# Patient Record
Sex: Female | Born: 1975 | Race: White | Hispanic: No | Smoking: Current every day smoker
Health system: Southern US, Community
[De-identification: ages and names within clinical notes are randomized; demographics above are authoritative.]

## PROBLEM LIST (undated history)

## (undated) DIAGNOSIS — R000 Tachycardia, unspecified: Secondary | ICD-10-CM

## (undated) DIAGNOSIS — F1010 Alcohol abuse, uncomplicated: Secondary | ICD-10-CM

## (undated) DIAGNOSIS — E7800 Pure hypercholesterolemia, unspecified: Secondary | ICD-10-CM

## (undated) HISTORY — PX: BREAST ENHANCEMENT SURGERY: SHX7

---

## 2014-04-18 ENCOUNTER — Encounter (HOSPITAL_COMMUNITY): Payer: Self-pay | Admitting: Emergency Medicine

## 2014-04-18 ENCOUNTER — Emergency Department (HOSPITAL_COMMUNITY)
Admission: EM | Admit: 2014-04-18 | Discharge: 2014-04-18 | Disposition: A | Discharge status: Home / Self Care | Payer: Medicaid Other | Attending: Emergency Medicine | Admitting: Emergency Medicine

## 2014-04-18 DIAGNOSIS — Y929 Unspecified place or not applicable: Secondary | ICD-10-CM | POA: Insufficient documentation

## 2014-04-18 DIAGNOSIS — W269XXA Contact with unspecified sharp object(s), initial encounter: Secondary | ICD-10-CM | POA: Insufficient documentation

## 2014-04-18 DIAGNOSIS — S61219A Laceration without foreign body of unspecified finger without damage to nail, initial encounter: Secondary | ICD-10-CM

## 2014-04-18 DIAGNOSIS — Y9389 Activity, other specified: Secondary | ICD-10-CM | POA: Insufficient documentation

## 2014-04-18 DIAGNOSIS — S61209A Unspecified open wound of unspecified finger without damage to nail, initial encounter: Secondary | ICD-10-CM | POA: Insufficient documentation

## 2014-04-18 DIAGNOSIS — F17200 Nicotine dependence, unspecified, uncomplicated: Secondary | ICD-10-CM | POA: Insufficient documentation

## 2014-04-18 MED ORDER — IBUPROFEN 800 MG PO TABS
ORAL_TABLET | ORAL | Status: AC
  Filled 2014-04-18: qty 1

## 2014-04-18 MED ORDER — IBUPROFEN 800 MG PO TABS
800.0000 mg | ORAL_TABLET | Freq: Once | ORAL | Status: AC
  Administered 2014-04-18: 800 mg via ORAL

## 2014-04-18 MED ORDER — LIDOCAINE HCL (PF) 1 % IJ SOLN
INTRAMUSCULAR | Status: DC
Start: 2014-04-18 — End: 2014-04-18
  Filled 2014-04-18: qty 5

## 2014-04-18 MED ORDER — LIDOCAINE HCL (PF) 1 % IJ SOLN: 5.0000 mL | Freq: Once | INTRAMUSCULAR | Status: DC

## 2014-04-18 NOTE — Discharge Instructions (Signed)
Laceration Care, Adult °A laceration is a cut that goes through all layers of the skin. The cut goes into the tissue beneath the skin. °HOME CARE °For stitches (sutures) or staples: °· Keep the cut clean and dry. °· If you have a bandage (dressing), change it at least once a day. Change the bandage if it gets wet or dirty, or as told by your doctor. °· Wash the cut with soap and water 2 times a day. Rinse the cut with water. Pat it dry with a clean towel. °· Put a thin layer of medicated cream on the cut as told by your doctor. °· You may shower after the first 24 hours. Do not soak the cut in water until the stitches are removed. °· Only take medicines as told by your doctor. °· Have your stitches or staples removed as told by your doctor. °For skin adhesive strips: °· Keep the cut clean and dry. °· Do not get the strips wet. You may take a bath, but be careful to keep the cut dry. °· If the cut gets wet, pat it dry with a clean towel. °· The strips will fall off on their own. Do not remove the strips that are still stuck to the cut. °For wound glue: °· You may shower or take baths. Do not soak or scrub the cut. Do not swim. Avoid heavy sweating until the glue falls off on its own. After a shower or bath, pat the cut dry with a clean towel. °· Do not put medicine on your cut until the glue falls off. °· If you have a bandage, do not put tape over the glue. °· Avoid lots of sunlight or tanning lamps until the glue falls off. Put sunscreen on the cut for the first year to reduce your scar. °· The glue will fall off on its own. Do not pick at the glue. °You may need a tetanus shot if: °· You cannot remember when you had your last tetanus shot. °· You have never had a tetanus shot. °If you need a tetanus shot and you choose not to have one, you may get tetanus. Sickness from tetanus can be serious. °GET HELP RIGHT AWAY IF:  °· Your pain does not get better with medicine. °· Your arm, hand, leg, or foot loses feeling  (numbness) or changes color. °· Your cut is bleeding. °· Your joint feels weak, or you cannot use your joint. °· You have painful lumps on your body. °· Your cut is red, puffy (swollen), or painful. °· You have a red line on the skin near the cut. °· You have yellowish-white fluid (pus) coming from the cut. °· You have a fever. °· You have a bad smell coming from the cut or bandage. °· Your cut breaks open before or after stitches are removed. °· You notice something coming out of the cut, such as wood or glass. °· You cannot move a finger or toe. °MAKE SURE YOU:  °· Understand these instructions. °· Will watch your condition. °· Will get help right away if you are not doing well or get worse. °Document Released: 05/19/2008 Document Revised: 02/23/2012 Document Reviewed: 05/27/2011 °ExitCare® Patient Information ©2014 [employer]. ° ° ° ° °
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
## 2014-04-18 NOTE — ED Provider Notes (Signed)
CSN: [id number]     Arrival date & time 04/18/14  1507 History   First MD Initiated Contact with Patient 04/18/14 1609     Chief Complaint  Patient presents with  . Laceration     (Consider location/radiation/quality/duration/timing/severity/associated sxs/prior Treatment) Patient is a 38 y.o. female presenting with skin laceration. The history is provided by the patient.  Laceration Location:  Finger Finger laceration location:  R little finger Length (cm):  1.5 Depth:  Cutaneous Quality comment:  Flap Bleeding: controlled   Laceration mechanism:  Broken glass Pain details:    Quality:  Aching   Severity:  Mild   Timing:  Constant   Progression:  Unchanged Foreign body present:  Glass Relieved by:  Nothing Worsened by:  Movement Ineffective treatments:  None tried Tetanus status:  Up to date   History reviewed. No pertinent past medical history. Past Surgical History  Procedure Laterality Date  . Breast enhancement surgery     History reviewed. No pertinent family history. History  Substance Use Topics  . Smoking status: Current Every Day Smoker -- 1.00 packs/day  . Smokeless tobacco: Not on file  . Alcohol Use: Yes     Comment: social   OB History   Grav Para Term Preterm Abortions TAB SAB Ect Mult Living                 Review of Systems  Constitutional: Negative for fever and chills.  Musculoskeletal: Negative for arthralgias, back pain and joint swelling.  Skin: Positive for wound.       Laceration right fifth finger  Neurological: Negative for dizziness, weakness and numbness.  Hematological: Does not bruise/bleed easily.  All other systems reviewed and are negative.     Allergies  Review of patient's allergies indicates not on file.  Home Medications   Prior to Admission medications   Not on File   BP 111/70  Pulse 106  Temp(Src) 98 F (36.7 C) (Oral)  Resp 18  Ht 5\' 5"  (1.651 m)  Wt 172 lb (78.019 kg)  BMI 28.62 kg/m2  SpO2  100% Physical Exam  Nursing note and vitals reviewed. Constitutional: She is oriented to person, place, and time. She appears well-developed and well-nourished. No distress.  HENT:  Head: Normocephalic and atraumatic.  Cardiovascular: Normal rate, regular rhythm, normal heart sounds and intact distal pulses.   No murmur heard. Pulmonary/Chest: Effort normal and breath sounds normal. No respiratory distress.  Musculoskeletal: She exhibits no edema.       Right hand: She exhibits tenderness and laceration. She exhibits normal range of motion, no bony tenderness, normal two-point discrimination, normal capillary refill, no deformity and no swelling. Normal sensation noted. Normal strength noted. She exhibits no finger abduction.       Hands: 1.5 cm flap type laceration to the proximal right fifth figner.  Bleeding controlled.  No edema.  Pt has full ROM of the finger.  Distal sensation intact.  CR< 2 sec.  No muscle , tendon or nerve injury seen on exam.  No FB's  Neurological: She is alert and oriented to person, place, and time. She exhibits normal muscle tone. Coordination normal.  Skin: Skin is warm.    ED Course  Procedures (including critical care time) Labs Review Labs Reviewed - No data to display  Imaging Review No results found.   EKG Interpretation None       LACERATION REPAIR Performed by: Kambra Beachem L. Riyan Haile Authorized by: Albaro Deviney L. Devinn Hurwitz Consent: Verbal consent obtained.  Risks and benefits: risks, benefits and alternatives were discussed Consent given by: patient Patient identity confirmed: provided demographic data Prepped and Draped in normal sterile fashion Wound explored  Laceration Location: proximal right fifth finger Laceration Length: 1.5 cm  No Foreign Bodies seen or palpated  Anesthesia: local infiltration  Local anesthetic: lidocaine 1 % w/o epinephrine  Anesthetic total: 1 ml  Irrigation method: syringe Amount of cleaning: standard  Skin  closure: 4-0 ethilon  Number of sutures: 3  Technique: simple interrupted  Patient tolerance: Patient tolerated the procedure well with no immediate complications.  MDM   Final diagnoses:  Laceration of finger    Pt is well appearing, vital stable.  Pt agrees to mild soap and water cleaning and keep the wound bandaged.  Sutures out in 10 days.  Advised to return here for any signs of infection.  Pt agrees to plan and appears stable for discharge.    Yazmyn Valbuena L. Trisha Mangleriplett, PA-C 04/18/14 1649

## 2014-04-18 NOTE — ED Provider Notes (Signed)
Medical screening examination/treatment/procedure(s) were performed by non-physician practitioner and as supervising physician I was immediately available for consultation/collaboration.   EKG Interpretation None        Joya Gaskinsonald W Lyan Moyano, MD 04/18/14 2317

## 2014-04-22 ENCOUNTER — Emergency Department (HOSPITAL_COMMUNITY)
Admission: EM | Admit: 2014-04-22 | Discharge: 2014-04-22 | Disposition: A | Discharge status: Home / Self Care | Payer: Medicaid Other | Attending: Emergency Medicine | Admitting: Emergency Medicine

## 2014-04-22 ENCOUNTER — Encounter (HOSPITAL_COMMUNITY): Payer: Self-pay | Admitting: Emergency Medicine

## 2014-04-22 DIAGNOSIS — Z79899 Other long term (current) drug therapy: Secondary | ICD-10-CM | POA: Insufficient documentation

## 2014-04-22 DIAGNOSIS — T8140XA Infection following a procedure, unspecified, initial encounter: Secondary | ICD-10-CM | POA: Insufficient documentation

## 2014-04-22 DIAGNOSIS — L089 Local infection of the skin and subcutaneous tissue, unspecified: Secondary | ICD-10-CM

## 2014-04-22 DIAGNOSIS — T148XXA Other injury of unspecified body region, initial encounter: Secondary | ICD-10-CM

## 2014-04-22 DIAGNOSIS — Y838 Other surgical procedures as the cause of abnormal reaction of the patient, or of later complication, without mention of misadventure at the time of the procedure: Secondary | ICD-10-CM | POA: Insufficient documentation

## 2014-04-22 DIAGNOSIS — F17200 Nicotine dependence, unspecified, uncomplicated: Secondary | ICD-10-CM | POA: Insufficient documentation

## 2014-04-22 MED ORDER — CEPHALEXIN 500 MG PO CAPS: 500.0000 mg | ORAL_CAPSULE | Freq: Four times a day (QID) | ORAL | Status: DC

## 2014-04-22 MED ORDER — HYDROCODONE-ACETAMINOPHEN 5-325 MG PO TABS: 2.0000 | ORAL_TABLET | ORAL | Status: DC | PRN

## 2014-04-22 MED ORDER — BACITRACIN-NEOMYCIN-POLYMYXIN 400-5-5000 EX OINT
TOPICAL_OINTMENT | Freq: Once | CUTANEOUS | Status: AC
  Administered 2014-04-22: 1 via TOPICAL
  Filled 2014-04-22: qty 1

## 2014-04-22 MED ORDER — HYDROCODONE-ACETAMINOPHEN 5-325 MG PO TABS: 1.0000 | ORAL_TABLET | ORAL | Status: DC | PRN

## 2014-04-22 NOTE — ED Notes (Signed)
Suture were placed to right 5th finger four days ago. Swelling and redness noted around sutures.

## 2014-04-22 NOTE — ED Provider Notes (Signed)
CSN: [id number]     Arrival date & time 04/22/14  [id number] History   First MD Initiated Contact with Patient 04/22/14 1015     Chief Complaint  Patient presents with  . Wound Check     (Consider location/radiation/quality/duration/timing/severity/associated sxs/prior Treatment) Patient is a 38 y.o. female presenting with wound check. The history is provided by the patient.  Wound Check This is a new problem. The current episode started yesterday. The problem occurs constantly. The problem has been gradually worsening. She has tried nothing for the symptoms.   Stephanie Parrish is a 38 y.o. female who presents to the ED for wound check. She had sutures place to the fifth finger of the right hand 4 days ago. She states that the past 2 days the area around the sutures has become red and swollen and looks infected. She complains of pain to the area. No fever or chills. No other problems.   History reviewed. No pertinent past medical history. Past Surgical History  Procedure Laterality Date  . Breast enhancement surgery     No family history on file. History  Substance Use Topics  . Smoking status: Current Every Day Smoker -- 1.00 packs/day  . Smokeless tobacco: Not on file  . Alcohol Use: Yes     Comment: social   OB History   Grav Para Term Preterm Abortions TAB SAB Ect Mult Living                 Review of Systems Negative except as stated in HPI   Allergies  Review of patient's allergies indicates no known allergies.  Home Medications   Prior to Admission medications   Medication Sig Start Date End Date Taking? Authorizing Provider  lamoTRIgine (LAMICTAL) 25 MG tablet Take 25 mg by mouth 2 (two) times daily.   Yes Historical Provider, MD  lisdexamfetamine (VYVANSE) 30 MG capsule Take 30 mg by mouth daily.   Yes Historical Provider, MD   BP 104/69  Pulse 88  Temp(Src) 98.9 F (37.2 C) (Oral)  Resp 16  SpO2 100%  LMP 04/08/2014 Physical Exam  Nursing note and vitals  reviewed. Constitutional: She is oriented to person, place, and time. She appears well-developed and well-nourished. No distress.  HENT:  Head: Normocephalic.  Eyes: EOM are normal.  Neck: Neck supple.  Cardiovascular: Normal rate.   Pulmonary/Chest: Effort normal.  Abdominal: There is no tenderness.  Musculoskeletal: Normal range of motion.       Right hand: She exhibits tenderness, laceration and swelling. She exhibits normal range of motion and no deformity. Normal sensation noted. Normal strength noted.       Hands: Erythema and tenderness to the dorsal aspect of the right hand fifth digit. Sutures in place.   Neurological: She is alert and oriented to person, place, and time. No cranial nerve deficit.  Skin: Skin is warm and dry.  Psychiatric: She has a normal mood and affect.    ED Course  Procedures Sutures removed, cleaned, bacitracin ointment and dressing.   MDM  38 y.o. female with infected wound to the fifth digit of the right hand. Will treat with antibiotics and pain medication. She will return if symptoms worsen. Stable for discharge without fever or red streaking. Discussed with the patient and all questioned fully answered.    Medication List    TAKE these medications       cephALEXin 500 MG capsule  Commonly known as:  KEFLEX  Take 1 capsule (500 mg total) by  mouth 4 (four) times daily.     HYDROcodone-acetaminophen 5-325 MG per tablet  Commonly known as:  NORCO/VICODIN  Take 2 tablets by mouth every 4 (four) hours as needed.      ASK your doctor about these medications       lamoTRIgine 25 MG tablet  Commonly known as:  LAMICTAL  Take 25 mg by mouth 2 (two) times daily.     lisdexamfetamine 30 MG capsule  Commonly known as:  VYVANSE  Take 30 mg by mouth daily.           Janne NapoleonHope M Koralee Wedeking, [place]NP 04/22/14 [phone]

## 2014-04-22 NOTE — Discharge Instructions (Signed)
Wound Infection °A wound infection happens when a type of germ (bacteria) grows in a wound. Caring for the infection can help the wound heal. Wound infections need treatment. °HOME CARE  °· Only take medicine as told by your doctor. °· Take your antibiotic medicine as told. Finish it even if you start to feel better. °· Clean the wound with mild soap and water as told. Rinse the soap off. Pat the area dry with a clean towel. Do not rub the wound. °· Change any bandages (dressings) as told by your doctor. °· Put cream and a bandage on the wound as told by your doctor. °· If the bandage sticks, wet it with soapy water to remove the bandage. °· Change the bandage if it gets wet, dirty, or starts to smell. °· Take showers. Do not take baths, swim, or do anything that puts your wound under water. °· Avoid exercise that makes you sweat. °· If your wound itches, use a medicine that helps stop itching. Do not pick or scratch at the wound. °· Keep all doctor visits as told. °GET HELP RIGHT AWAY IF:  °· You have more puffiness (swelling), pain, or redness around the wound. °· You have more yellowish-white fluid (pus) coming from the wound. °· You have a bad smell coming from the wound. °· Your wound breaks open more. °· You have a fever. °MAKE SURE YOU:  °· Understand these instructions. °· Will watch your condition. °· Will get help right away if you are not doing well or get worse. °Document Released: 09/09/2008 Document Revised: 02/23/2012 Document Reviewed: 05/12/2011 °[employer]® Patient Information ©2014 [employer]. ° °

## 2014-04-23 NOTE — ED Provider Notes (Signed)
Medical screening examination/treatment/procedure(s) were performed by non-physician practitioner and as supervising physician I was immediately available for consultation/collaboration.  Stephanie MelterElliott L Terek Bee, MD 04/23/14 [phone]

## 2014-05-06 ENCOUNTER — Encounter (HOSPITAL_COMMUNITY): Payer: Self-pay | Admitting: Emergency Medicine

## 2014-05-06 ENCOUNTER — Emergency Department (HOSPITAL_COMMUNITY)
Admission: EM | Admit: 2014-05-06 | Discharge: 2014-05-06 | Disposition: A | Discharge status: Home / Self Care | Payer: Medicaid Other | Attending: Emergency Medicine | Admitting: Emergency Medicine

## 2014-05-06 ENCOUNTER — Emergency Department (HOSPITAL_COMMUNITY): Payer: Medicaid Other

## 2014-05-06 DIAGNOSIS — G5732 Lesion of lateral popliteal nerve, left lower limb: Secondary | ICD-10-CM

## 2014-05-06 DIAGNOSIS — G5730 Lesion of lateral popliteal nerve, unspecified lower limb: Secondary | ICD-10-CM | POA: Insufficient documentation

## 2014-05-06 DIAGNOSIS — Z79899 Other long term (current) drug therapy: Secondary | ICD-10-CM | POA: Insufficient documentation

## 2014-05-06 DIAGNOSIS — F17200 Nicotine dependence, unspecified, uncomplicated: Secondary | ICD-10-CM | POA: Insufficient documentation

## 2014-05-06 MED ORDER — OXYCODONE-ACETAMINOPHEN 5-325 MG PO TABS
1.0000 | ORAL_TABLET | Freq: Once | ORAL | Status: AC
  Administered 2014-05-06: 1 via ORAL
  Filled 2014-05-06: qty 1

## 2014-05-06 MED ORDER — HYDROCODONE-ACETAMINOPHEN 5-325 MG PO TABS: 1.0000 | ORAL_TABLET | ORAL | Status: DC | PRN
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
## 2014-05-06 NOTE — ED Notes (Signed)
Pt had ambulated from triage to room
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
## 2014-05-06 NOTE — ED Notes (Signed)
Pt verbalized understanding of no driving within 4 hours of taking Vicodin

## 2014-06-19 ENCOUNTER — Encounter (HOSPITAL_COMMUNITY): Payer: Self-pay | Admitting: *Deleted

## 2014-06-19 ENCOUNTER — Inpatient Hospital Stay (HOSPITAL_COMMUNITY)
Admission: AD | Admit: 2014-06-19 | Discharge: 2014-06-19 | Disposition: A | Discharge status: Home / Self Care | Payer: Medicaid Other | Source: Ambulatory Visit | Attending: Family Medicine | Admitting: Family Medicine

## 2014-06-19 DIAGNOSIS — N760 Acute vaginitis: Secondary | ICD-10-CM | POA: Diagnosis present

## 2014-06-19 DIAGNOSIS — B9689 Other specified bacterial agents as the cause of diseases classified elsewhere: Secondary | ICD-10-CM | POA: Insufficient documentation

## 2014-06-19 DIAGNOSIS — F17200 Nicotine dependence, unspecified, uncomplicated: Secondary | ICD-10-CM | POA: Diagnosis not present

## 2014-06-19 DIAGNOSIS — A499 Bacterial infection, unspecified: Secondary | ICD-10-CM

## 2014-06-19 DIAGNOSIS — F1010 Alcohol abuse, uncomplicated: Secondary | ICD-10-CM | POA: Diagnosis present

## 2014-06-19 LAB — WET PREP, GENITAL
Trich, Wet Prep: NONE SEEN
Yeast Wet Prep HPF POC: NONE SEEN

## 2014-06-19 LAB — CBC
HCT: 40.7 % (ref 36.0–46.0)
HEMOGLOBIN: 14.5 g/dL (ref 12.0–15.0)
MCH: 32.9 pg (ref 26.0–34.0)
MCHC: 35.6 g/dL (ref 30.0–36.0)
MCV: 92.3 fL (ref 78.0–100.0)
Platelets: 244 10*3/uL (ref 150–400)
RBC: 4.41 MIL/uL (ref 3.87–5.11)
RDW: 12.9 % (ref 11.5–15.5)
WBC: 7.2 10*3/uL (ref 4.0–10.5)

## 2014-06-19 LAB — ETHANOL: Alcohol, Ethyl (B): 276 mg/dL — ABNORMAL HIGH (ref 0–11)

## 2014-06-19 MED ORDER — METRONIDAZOLE 500 MG PO TABS: 500.0000 mg | ORAL_TABLET | Freq: Two times a day (BID) | ORAL | Status: DC

## 2014-06-19 MED ORDER — FLUCONAZOLE 150 MG PO TABS: 150.0000 mg | ORAL_TABLET | Freq: Every day | ORAL | Status: DC

## 2014-06-19 MED ORDER — ACETAMINOPHEN 325 MG PO TABS
650.0000 mg | ORAL_TABLET | Freq: Once | ORAL | Status: AC
  Administered 2014-06-19: 650 mg via ORAL
  Filled 2014-06-19: qty 2

## 2014-06-19 NOTE — MAU Note (Signed)
Patient dozing and not awakened during check. 

## 2014-06-19 NOTE — MAU Note (Addendum)
Patient uncommunicative and answering questions reluctantly. Frequently answers with a noncommital moan or profanity. Dozes off between questions and must repeatedly be awakened. Some answers appear sarcastic in nature (when asked if she used smokeless tobacco products, patient moaned. When asked to verify her answer, she shouted that "I use them every chance I get!" and then stated "the answer is obviously no!").

## 2014-06-19 NOTE — MAU Note (Signed)
Patient requesting a prescription for diflucan so that she doesn't "have to waste 4 more hours in the ER to be treated for a yeast infection after taking these antibiotics!" Order obtained for diflucan.

## 2014-06-19 NOTE — MAU Note (Signed)
Assumed care of patient.

## 2014-06-19 NOTE — MAU Note (Signed)
Patient states she has had a MIrena for about 2 1/2 years. States she she has had bleeding after intercourse before. States she went to an Urgent Care today for having bleeding after intercourse with abdominal pain. Patient states she has been drinking.

## 2014-06-19 NOTE — MAU Note (Signed)
Patient walked out of her room and up to the nursing station and stated that she needed to leave. She was asked by the personnel to return to her room and her nurse would be in to assist her. Upon entering the room, patient stated that she was tired of waiting and needed to pick her kids up from school at 3:30 and was ready to leave. When presented with the AMA form to sign, she refused to sign stating that if she did so, she would have to pay for the MAU visit and that was not going to happen. Explained to patient that [name] should be in shortly to discuss her lab results with her and facilitate her discharge.

## 2014-06-19 NOTE — MAU Note (Signed)
During the discharge process, patient shouted at the provider that she had not even offered her "a fucking tylenol for the cramps!" Order received for a tylenol.

## 2014-06-19 NOTE — MAU Note (Signed)
Patient answering the provider's queries with sarcasm and profanity (When asked who placed her mirena, she stated "[place] [facility] [place]!" When asked to explain, she answered "[employer]!" When [name] informed her that she saw very little blood during the exam, she shouted "oh yeah! I love spending my spare time coming to the ER to have my vagina spectrum(ed) because I love to have THAT done!"). Politely asked by [name] not to use profanity.

## 2014-06-19 NOTE — MAU Note (Signed)
Patient dozing and not awakened during check.

## 2014-06-19 NOTE — MAU Provider Note (Signed)
History     CSN: [id number]  Arrival date and time: 06/19/14 1219   First Provider Initiated Contact with Patient 06/19/14 1316      Chief Complaint  Patient presents with  . Vaginal Bleeding  . Abdominal Pain   HPI Comments: Stephanie Parrish 38 y.o. [id number] presents to MAU for vaginal bleeding after intercourse. She describes her partners measurementsShe has a [place] IUD for 2.5 years and states that a " bad mother fucker " [place]  She admits she has been drinking. She is uncooperative with nurses questions, cursing and insisting her time is as valuable as ours.  Vaginal Bleeding Associated symptoms include abdominal pain.  Abdominal Pain      Past Medical History  Diagnosis Date  . Medical history non-contributory     Past Surgical History  Procedure Laterality Date  . Breast enhancement surgery      History reviewed. No pertinent family history.  History  Substance Use Topics  . Smoking status: Current Every Day Smoker -- 1.00 packs/day  . Smokeless tobacco: Never Used  . Alcohol Use: Yes     Comment: social    Allergies: No Known Allergies  Prescriptions prior to admission  Medication Sig Dispense Refill  . FLUoxetine (PROZAC) 20 MG capsule Take 20 mg by mouth daily.      Marland Kitchen. HYDROcodone-acetaminophen (NORCO/VICODIN) 5-325 MG per tablet Take 1 tablet by mouth every 4 (four) hours as needed for moderate pain.  12 tablet  0  . lamoTRIgine (LAMICTAL) 25 MG tablet Take 25 mg by mouth 2 (two) times daily.      Marland Kitchen. lisdexamfetamine (VYVANSE) 30 MG capsule Take 30 mg by mouth daily.        Review of Systems  Constitutional: Negative.   HENT: Negative.   Eyes: Negative.   Respiratory: Negative.   Cardiovascular: Negative.   Gastrointestinal: Positive for abdominal pain.  Genitourinary: Negative.        Vaginal bleeding  Musculoskeletal: Negative.   Skin: Negative.   Neurological: Negative.   Psychiatric/Behavioral: Negative.    Physical  Exam   Blood pressure 141/93, pulse 106, temperature 98 F (36.7 C), temperature source Oral, resp. rate 16, height 5' 4.5" (1.638 m), weight 79.289 kg (174 lb 12.8 oz), SpO2 96.00%.  Physical Exam  Constitutional: She is oriented to person, place, and time. She appears well-developed and well-nourished. No distress.  Appears drunk/ high  HENT:  Head: Normocephalic and atraumatic.  Eyes: Pupils are equal, round, and reactive to light.  GI: Soft. Bowel sounds are normal. She exhibits no distension. There is no tenderness. There is no rebound and no guarding.  Genitourinary:  Genital:External negative Vaginal:very small amount blood Cervix:closed/ thick/ strings present Bimanual: nontender   Musculoskeletal: Normal range of motion.  Neurological: She is alert and oriented to person, place, and time.  Skin: Skin is warm.  Psychiatric: Her affect is angry and inappropriate. Her speech is slurred. She is aggressive. Cognition and memory are impaired. She expresses inappropriate judgment.   Results for orders placed during the hospital encounter of 06/19/14 (from the past 24 hour(s))  WET PREP, GENITAL     Status: Abnormal   Collection Time    06/19/14  1:10 PM      Result Value Ref Range   Yeast Wet Prep HPF POC NONE SEEN  NONE SEEN   Trich, Wet Prep NONE SEEN  NONE SEEN   Clue Cells Wet Prep HPF POC FEW (*) NONE SEEN  WBC, Wet Prep HPF POC FEW (*) NONE SEEN  CBC     Status: None   Collection Time    06/19/14  1:15 PM      Result Value Ref Range   WBC 7.2  4.0 - 10.5 K/uL   RBC 4.41  3.87 - 5.11 MIL/uL   Hemoglobin 14.5  12.0 - 15.0 g/dL   HCT 16.140.7  09.636.0 - 04.546.0 %   MCV 92.3  78.0 - 100.0 fL   MCH 32.9  26.0 - 34.0 pg   MCHC 35.6  30.0 - 36.0 g/dL   RDW 40.912.9  81.111.5 - 91.415.5 %   Platelets 244  150 - 400 K/uL    MAU Course  Procedures  MDM USD, Etoh level, CBC Pt is snoring/ sound asleep Pt awake and swearing ar nurses  Assessment and Plan   A: Bacterial vaginosis  P:  Flagyl 500 mg PO BID x 7 days No alcohol x 7 days Establish GYN care  Carolynn ServeBarefoot, Keitra Carusone Miller 06/19/2014, 1:16 PM

## 2014-06-19 NOTE — Discharge Instructions (Signed)
Bacterial Vaginosis Bacterial vaginosis is a vaginal infection that occurs when the normal balance of bacteria in the vagina is disrupted. It results from an overgrowth of certain bacteria. This is the most common vaginal infection in women of childbearing age. Treatment is important to prevent complications, especially in pregnant women, as it can cause a premature delivery. CAUSES  Bacterial vaginosis is caused by an increase in harmful bacteria that are normally present in smaller amounts in the vagina. Several different kinds of bacteria can cause bacterial vaginosis. However, the reason that the condition develops is not fully understood. RISK FACTORS Certain activities or behaviors can put you at an increased risk of developing bacterial vaginosis, including:  Having a new sex partner or multiple sex partners.  Douching.  Using an intrauterine device (IUD) for contraception. Women do not get bacterial vaginosis from toilet seats, bedding, swimming pools, or contact with objects around them. SIGNS AND SYMPTOMS  Some women with bacterial vaginosis have no signs or symptoms. Common symptoms include:  Grey vaginal discharge.  A fishlike odor with discharge, especially after sexual intercourse.  Itching or burning of the vagina and vulva.  Burning or pain with urination. DIAGNOSIS  Your health care provider will take a medical history and examine the vagina for signs of bacterial vaginosis. A sample of vaginal fluid may be taken. Your health care provider will look at this sample under a microscope to check for bacteria and abnormal cells. A vaginal pH test may also be done.  TREATMENT  Bacterial vaginosis may be treated with antibiotic medicines. These may be given in the form of a pill or a vaginal cream. A second round of antibiotics may be prescribed if the condition comes back after treatment.  HOME CARE INSTRUCTIONS   Only take over-the-counter or prescription medicines as  directed by your health care provider.  If antibiotic medicine was prescribed, take it as directed. Make sure you finish it even if you start to feel better.  Do not have sex until treatment is completed.  Tell all sexual partners that you have a vaginal infection. They should see their health care provider and be treated if they have problems, such as a mild rash or itching.  Practice safe sex by using condoms and only having one sex partner. SEEK MEDICAL CARE IF:   Your symptoms are not improving after 3 days of treatment.  You have increased discharge or pain.  You have a fever. MAKE SURE YOU:   Understand these instructions.  Will watch your condition.  Will get help right away if you are not doing well or get worse. FOR MORE INFORMATION  Centers for Disease Control and Prevention, Division of STD Prevention: www.cdc.gov/std American Sexual Health Association (ASHA): www.ashastd.org  Document Released: 12/01/2005 Document Revised: 09/21/2013 Document Reviewed: 07/13/2013 ExitCare Patient Information 2015 [employer]. This information is not intended to replace advice given to you by your health care provider. Make sure you discuss any questions you have with your health care provider.  

## 2014-06-20 LAB — GC/CHLAMYDIA PROBE AMP
CT PROBE, AMP APTIMA: NEGATIVE
GC Probe RNA: NEGATIVE

## 2014-06-20 NOTE — MAU Provider Note (Signed)
Attestation of Attending Supervision of Advanced Practitioner (PA/CNM/NP): Evaluation and management procedures were performed by the Advanced Practitioner under my supervision and collaboration.  I have reviewed the Advanced Practitioner's note and chart, and I agree with the management and plan.  Jalana Moore S, MD [facility] Attending 06/20/2014 8:31 AM   

## 2014-07-06 ENCOUNTER — Encounter: Payer: Self-pay | Admitting: Obstetrics & Gynecology

## 2014-07-29 ENCOUNTER — Encounter (HOSPITAL_COMMUNITY): Payer: Self-pay | Admitting: Emergency Medicine

## 2014-07-29 ENCOUNTER — Emergency Department (HOSPITAL_COMMUNITY)
Admission: EM | Admit: 2014-07-29 | Discharge: 2014-07-30 | Disposition: A | Discharge status: Home / Self Care | Payer: Self-pay | Attending: Emergency Medicine | Admitting: Emergency Medicine

## 2014-07-29 ENCOUNTER — Emergency Department (HOSPITAL_COMMUNITY): Payer: Self-pay

## 2014-07-29 DIAGNOSIS — W010XXA Fall on same level from slipping, tripping and stumbling without subsequent striking against object, initial encounter: Secondary | ICD-10-CM | POA: Insufficient documentation

## 2014-07-29 DIAGNOSIS — Z3202 Encounter for pregnancy test, result negative: Secondary | ICD-10-CM | POA: Insufficient documentation

## 2014-07-29 DIAGNOSIS — Y929 Unspecified place or not applicable: Secondary | ICD-10-CM | POA: Insufficient documentation

## 2014-07-29 DIAGNOSIS — S0180XA Unspecified open wound of other part of head, initial encounter: Secondary | ICD-10-CM | POA: Insufficient documentation

## 2014-07-29 DIAGNOSIS — Z79899 Other long term (current) drug therapy: Secondary | ICD-10-CM | POA: Insufficient documentation

## 2014-07-29 DIAGNOSIS — F17200 Nicotine dependence, unspecified, uncomplicated: Secondary | ICD-10-CM | POA: Insufficient documentation

## 2014-07-29 DIAGNOSIS — F1010 Alcohol abuse, uncomplicated: Secondary | ICD-10-CM | POA: Insufficient documentation

## 2014-07-29 DIAGNOSIS — F10920 Alcohol use, unspecified with intoxication, uncomplicated: Secondary | ICD-10-CM

## 2014-07-29 DIAGNOSIS — S0190XA Unspecified open wound of unspecified part of head, initial encounter: Secondary | ICD-10-CM | POA: Insufficient documentation

## 2014-07-29 DIAGNOSIS — S0181XA Laceration without foreign body of other part of head, initial encounter: Secondary | ICD-10-CM

## 2014-07-29 DIAGNOSIS — Z23 Encounter for immunization: Secondary | ICD-10-CM | POA: Insufficient documentation

## 2014-07-29 DIAGNOSIS — R4182 Altered mental status, unspecified: Secondary | ICD-10-CM | POA: Insufficient documentation

## 2014-07-29 DIAGNOSIS — Y9389 Activity, other specified: Secondary | ICD-10-CM | POA: Insufficient documentation

## 2014-07-29 MED ORDER — TETANUS-DIPHTH-ACELL PERTUSSIS 5-2.5-18.5 LF-MCG/0.5 IM SUSP
0.5000 mL | Freq: Once | INTRAMUSCULAR | Status: AC
  Administered 2014-07-29: 0.5 mL via INTRAMUSCULAR
  Filled 2014-07-29: qty 0.5

## 2014-07-29 NOTE — ED Notes (Signed)
Pt was found in fetal position by EMS on sidewalk, pt stated she tripped and struck her head, has laceration to forehead, swelling above right eye.

## 2014-07-30 ENCOUNTER — Emergency Department (HOSPITAL_COMMUNITY): Payer: Medicaid Other

## 2014-07-30 LAB — ETHANOL: ALCOHOL ETHYL (B): 158 mg/dL — AB (ref 0–11)

## 2014-07-30 LAB — CBC WITH DIFFERENTIAL/PLATELET
BASOS PCT: 0 % (ref 0–1)
Basophils Absolute: 0 10*3/uL (ref 0.0–0.1)
EOS ABS: 0.1 10*3/uL (ref 0.0–0.7)
Eosinophils Relative: 1 % (ref 0–5)
HCT: 35.2 % — ABNORMAL LOW (ref 36.0–46.0)
HEMOGLOBIN: 12.3 g/dL (ref 12.0–15.0)
Lymphocytes Relative: 30 % (ref 12–46)
Lymphs Abs: 2.2 10*3/uL (ref 0.7–4.0)
MCH: 32.9 pg (ref 26.0–34.0)
MCHC: 34.9 g/dL (ref 30.0–36.0)
MCV: 94.1 fL (ref 78.0–100.0)
Monocytes Absolute: 0.4 10*3/uL (ref 0.1–1.0)
Monocytes Relative: 5 % (ref 3–12)
Neutro Abs: 4.7 10*3/uL (ref 1.7–7.7)
Neutrophils Relative %: 64 % (ref 43–77)
PLATELETS: 236 10*3/uL (ref 150–400)
RBC: 3.74 MIL/uL — AB (ref 3.87–5.11)
RDW: 12.6 % (ref 11.5–15.5)
WBC: 7.4 10*3/uL (ref 4.0–10.5)

## 2014-07-30 LAB — URINALYSIS, ROUTINE W REFLEX MICROSCOPIC
BILIRUBIN URINE: NEGATIVE
GLUCOSE, UA: NEGATIVE mg/dL
KETONES UR: NEGATIVE mg/dL
Leukocytes, UA: NEGATIVE
Nitrite: NEGATIVE
Protein, ur: NEGATIVE mg/dL
Specific Gravity, Urine: 1.02 (ref 1.005–1.030)
Urobilinogen, UA: 0.2 mg/dL (ref 0.0–1.0)
pH: 5.5 (ref 5.0–8.0)

## 2014-07-30 LAB — RAPID URINE DRUG SCREEN, HOSP PERFORMED
AMPHETAMINES: NOT DETECTED
BENZODIAZEPINES: NOT DETECTED
Barbiturates: NOT DETECTED
Cocaine: NOT DETECTED
OPIATES: NOT DETECTED
Tetrahydrocannabinol: NOT DETECTED

## 2014-07-30 LAB — BASIC METABOLIC PANEL
ANION GAP: 12 (ref 5–15)
BUN: 10 mg/dL (ref 6–23)
CHLORIDE: 104 meq/L (ref 96–112)
CO2: 25 meq/L (ref 19–32)
CREATININE: 0.74 mg/dL (ref 0.50–1.10)
Calcium: 8.3 mg/dL — ABNORMAL LOW (ref 8.4–10.5)
GFR calc Af Amer: >90 mL/min (ref >90)
GFR calc non Af Amer: >90 mL/min (ref >90)
Glucose, Bld: 105 mg/dL — ABNORMAL HIGH (ref 70–99)
Potassium: 3.6 mEq/L — ABNORMAL LOW (ref 3.7–5.3)
SODIUM: 141 meq/L (ref 137–147)

## 2014-07-30 LAB — PREGNANCY, URINE: Preg Test, Ur: NEGATIVE

## 2014-07-30 LAB — URINE MICROSCOPIC-ADD ON

## 2014-07-30 LAB — CK: Total CK: 129 U/L (ref 7–177)

## 2014-07-30 LAB — LACTIC ACID, PLASMA: Lactic Acid, Venous: 1.4 mmol/L (ref 0.5–2.2)

## 2014-07-30 MED ORDER — IBUPROFEN 800 MG PO TABS: 800.0000 mg | ORAL_TABLET | Freq: Once | ORAL | Status: DC

## 2014-07-30 MED ORDER — SODIUM CHLORIDE 0.9 % IV BOLUS (SEPSIS)
1000.0000 mL | Freq: Once | INTRAVENOUS | Status: AC
  Administered 2014-07-30: 1000 mL via INTRAVENOUS

## 2014-07-30 MED ORDER — NAPROXEN 500 MG PO TABS
500.0000 mg | ORAL_TABLET | Freq: Two times a day (BID) | ORAL | Status: DC
Start: 2014-07-30 — End: 2014-08-03

## 2014-07-30 MED ORDER — LIDOCAINE-EPINEPHRINE-TETRACAINE (LET) SOLUTION
3.0000 mL | Freq: Once | NASAL | Status: DC
  Filled 2014-07-30: qty 3

## 2014-07-30 MED ORDER — LIDOCAINE-EPINEPHRINE (PF) 1 %-1:200000 IJ SOLN
20.0000 mL | Freq: Once | INTRAMUSCULAR | Status: AC
  Administered 2014-07-30: 20 mL
  Filled 2014-07-30: qty 10

## 2014-07-30 MED ORDER — SODIUM CHLORIDE 0.9 % IV BOLUS (SEPSIS): 1000.0000 mL | Freq: Once | INTRAVENOUS | Status: DC

## 2014-07-30 NOTE — ED Provider Notes (Signed)
CSN: [id number]     Arrival date & time 07/29/14  2308 History   First MD Initiated Contact with Patient 07/29/14 2322     Chief Complaint  Patient presents with  . Head Laceration     (Consider location/radiation/quality/duration/timing/severity/associated sxs/prior Treatment) HPI  Stephanie Parrish is a 38 y.o. female brought in by EMS found in fetal position on the sidewalk. Patient states she tripped and struck her head as per EMS. Patient is somnolent and nonresponsive. Level V caveat secondary to altered mental status.  Past Medical History  Diagnosis Date  . Medical history non-contributory    Past Surgical History  Procedure Laterality Date  . Breast enhancement surgery     History reviewed. No pertinent family history. History  Substance Use Topics  . Smoking status: Current Every Day Smoker -- 1.00 packs/day  . Smokeless tobacco: Never Used  . Alcohol Use: Yes     Comment: social   OB History   Grav Para Term Preterm Abortions TAB SAB Ect Mult Living   10    4 1 3   6      Review of Systems  Unable to perform ROS: Mental status change      Allergies  Review of patient's allergies indicates no known allergies.  Home Medications   Prior to Admission medications   Medication Sig Start Date End Date Taking? Authorizing Provider  fluconazole (DIFLUCAN) 150 MG tablet Take 1 tablet (150 mg total) by mouth daily. 06/19/14   Delbert Phenix, NP  FLUoxetine (PROZAC) 20 MG capsule Take 20 mg by mouth daily.    Historical Provider, MD  HYDROcodone-acetaminophen (NORCO/VICODIN) 5-325 MG per tablet Take 1 tablet by mouth every 4 (four) hours as needed for moderate pain. 05/06/14   Lyanne Co, MD  metroNIDAZOLE (FLAGYL) 500 MG tablet Take 1 tablet (500 mg total) by mouth 2 (two) times daily. 06/19/14   Delbert Phenix, NP   BP 91/46  Pulse 94  Temp(Src) 97.4 F (36.3 C) (Oral)  Resp 16  Ht 5\' 4"  (1.626 m)  Wt 165 lb (74.844 kg)  BMI 28.31 kg/m2  SpO2  95% Physical Exam  Nursing note and vitals reviewed. Constitutional: She appears well-developed and well-nourished. No distress.  HENT:  Head: Normocephalic.  Mouth/Throat: Oropharynx is clear and moist.  1 cm full-thickness laceration to 4 head. Right for head contusion 2 cm. Pupils are constricted but reactive. No hemotympanums, battle signs, abnormal oto or rhinorrhea  Eyes: Conjunctivae and EOM are normal. Pupils are equal, round, and reactive to light.  Neck: Normal range of motion.  Cardiovascular: Normal rate, regular rhythm and intact distal pulses.   Pulmonary/Chest: Effort normal and breath sounds normal. No stridor. No respiratory distress. She has no wheezes. She has no rales. She exhibits no tenderness.  Abdominal: Soft. She exhibits no distension and no mass. There is no tenderness. There is no rebound and no guarding.  Musculoskeletal: Normal range of motion. She exhibits no edema and no tenderness.  Neurological:  Somnolent, responsive to sternal rub, moving all extremities, patient occasionally rouses and curses.  Psychiatric: She has a normal mood and affect.    ED Course  LACERATION REPAIR Date/Time: 07/30/2014 1:14 AM Performed by: Stephanie Emery Authorized by: Stephanie Emery Consent given by: patient Body area: head/neck Laceration length: 1 cm Foreign bodies: no foreign bodies Tendon involvement: none Nerve involvement: none Vascular damage: yes Local anesthetic: lidocaine 2% without epinephrine Anesthetic total: 1 ml Patient sedated: no Irrigation solution: saline  Irrigation method: jet lavage and syringe Skin closure: 5-0 Prolene Number of sutures: 2 Technique: simple Approximation: close Approximation difficulty: simple   (including critical care time) Labs Review Labs Reviewed  CBC WITH DIFFERENTIAL  BASIC METABOLIC PANEL  URINE RAPID DRUG SCREEN (HOSP PERFORMED)  URINALYSIS, ROUTINE W REFLEX MICROSCOPIC  PREGNANCY, URINE    Imaging  Review No results found.   EKG Interpretation None      MDM   Final diagnoses:  None    Filed Vitals:   07/29/14 2308 07/29/14 2314 07/29/14 2316 07/29/14 2330  BP: 91/46   93/55  Pulse: 97  94   Temp: 97.6 F (36.4 C)  97.4 F (36.3 C)   TempSrc: Oral  Oral   Resp: 16  16   Height: 5\' 4"  (1.626 m)  5\' 4"  (1.626 m)   Weight: 165 lb (74.844 kg)  165 lb (74.844 kg)   SpO2: 95% 95% 95%     Medications  sodium chloride 0.9 % bolus 1,000 mL (1,000 mLs Intravenous New Bag/Given 07/30/14 0030)  lidocaine-EPINEPHrine-tetracaine (LET) solution (not administered)  lidocaine-EPINEPHrine (XYLOCAINE-EPINEPHrine) 1 %-1:200000 (PF) injection 20 mL (not administered)  Tdap (BOOSTRIX) injection 0.5 mL (0.5 mLs Intramuscular Given 07/29/14 2344)    Stephanie Parrish is a 38 y.o. female presenting with altered mental status, likely intoxication. Patient is protecting her airway. Patient has head trauma however CT is negative. Small laceration to 4 head for.Blood work and urinalysis pending. Case signed out to Dr. Preston FleetingGlick at shift change.    Stephanie Emeryicole Mathis Cashman, PA-C 07/30/14 [phone]

## 2014-07-30 NOTE — ED Provider Notes (Signed)
The patient was brought in by EMS having been found lying on the ground with a 4 inch laceration. She was poorly responsive but had a negative CT scan and a nonfocal exam. Alcohol level came back at 158. Remainder of her laboratory work was unremarkable. After a period of observation in the ED, patient became awake, alert and somewhat combative and insisted that she wanted to leave so that she could smoke a cigarette. She had absolutely no focal neurologic findings. She is discharged with wound care and restrictions and a prescription for naproxen for pain. She is advised to have sutures removed in 5 days.  Medical screening examination/treatment/procedure(s) were conducted as a shared visit with non-physician practitioner(s) and myself.  I personally evaluated the patient during the encounter.   EKG Interpretation   Date/Time:  Sunday July 30 2014 00:54:59 EDT Ventricular Rate:  80 PR Interval:  125 QRS Duration: 86 QT Interval:  370 QTC Calculation: 427 R Axis:   48 Text Interpretation:  Sinus rhythm Borderline T abnormalities, anterior  leads No old tracing to compare Confirmed by [facility]  MD, Shakeera Rightmyer ([id number]) on  07/30/2014 1:13:19 AM        Dione Boozeavid Phinehas Grounds, MD 07/30/14 [phone]

## 2014-07-30 NOTE — Discharge Instructions (Signed)
Sutures need to be removed in 5 days.   Alcohol Intoxication Alcohol intoxication occurs when the amount of alcohol that a person has consumed impairs his or her ability to mentally and physically function. Alcohol directly impairs the normal chemical activity of the brain. Drinking large amounts of alcohol can lead to changes in mental function and behavior, and it can cause many physical effects that can be harmful.  Alcohol intoxication can range in severity from mild to very severe. Various factors can affect the level of intoxication that occurs, such as the person's age, gender, weight, frequency of alcohol consumption, and the presence of other medical conditions (such as diabetes, seizures, or heart conditions). Dangerous levels of alcohol intoxication may occur when people drink large amounts of alcohol in a short period (binge drinking). Alcohol can also be especially dangerous when combined with certain prescription medicines or "recreational" drugs. SIGNS AND SYMPTOMS Some common signs and symptoms of mild alcohol intoxication include:  Loss of coordination.  Changes in mood and behavior.  Impaired judgment.  Slurred speech. As alcohol intoxication progresses to more severe levels, other signs and symptoms will appear. These may include:  Vomiting.  Confusion and impaired memory.  Slowed breathing.  Seizures.  Loss of consciousness. DIAGNOSIS  Your health care provider will take a medical history and perform a physical exam. You will be asked about the amount and type of alcohol you have consumed. Blood tests will be done to measure the concentration of alcohol in your blood. In many places, your blood alcohol level must be lower than 80 mg/dL (4.09%0.08%) to legally drive. However, many dangerous effects of alcohol can occur at much lower levels.  TREATMENT  People with alcohol intoxication often do not require treatment. Most of the effects of alcohol intoxication are  temporary, and they go away as the alcohol naturally leaves the body. Your health care provider will monitor your condition until you are stable enough to go home. Fluids are sometimes given through an IV access tube to help prevent dehydration.  HOME CARE INSTRUCTIONS  Do not drive after drinking alcohol.  Stay hydrated. Drink enough water and fluids to keep your urine clear or pale yellow. Avoid caffeine.   Only take over-the-counter or prescription medicines as directed by your health care provider.  SEEK MEDICAL CARE IF:   You have persistent vomiting.   You do not feel better after a few days.  You have frequent alcohol intoxication. Your health care provider can help determine if you should see a substance use treatment counselor. SEEK IMMEDIATE MEDICAL CARE IF:   You become shaky or tremble when you try to stop drinking.   You shake uncontrollably (seizure).   You throw up (vomit) blood. This may be bright red or may look like black coffee grounds.   You have blood in your stool. This may be bright red or may appear as a black, tarry, bad smelling stool.   You become lightheaded or faint.  MAKE SURE YOU:   Understand these instructions.  Will watch your condition.  Will get help right away if you are not doing well or get worse. Document Released: 09/10/2005 Document Revised: 08/03/2013 Document Reviewed: 05/06/2013 [facility] Patient Information 2015 [employer] [place]. This information is not intended to replace advice given to you by your health care provider. Make sure you discuss any questions you have with your health care provider.  Naproxen and naproxen sodium oral immediate-release tablets What is this medicine? NAPROXEN (na PROX en) is  a non-steroidal anti-inflammatory drug (NSAID). It is used to reduce swelling and to treat pain. This medicine may be used for dental pain, headache, or painful monthly periods. It is also used for painful joint and muscular  problems such as arthritis, tendinitis, bursitis, and gout. This medicine may be used for other purposes; ask your health care provider or pharmacist if you have questions. COMMON BRAND NAME(S): Aflaxen, Aleve, Aleve Arthritis, All Day Relief, Anaprox, Anaprox DS, Naprosyn What should I tell my health care provider before I take this medicine? They need to know if you have any of these conditions: -asthma -cigarette smoker -drink more than 3 alcohol containing drinks a day -heart disease or circulation problems such as heart failure or leg edema (fluid retention) -high blood pressure -kidney disease -liver disease -stomach bleeding or ulcers -an unusual or allergic reaction to naproxen, aspirin, other NSAIDs, other medicines, foods, dyes, or preservatives -pregnant or trying to get pregnant -breast-feeding How should I use this medicine? Take this medicine by mouth with a glass of water. Follow the directions on the prescription label. Take it with food if your stomach gets upset. Try to not lie down for at least 10 minutes after you take it. Take your medicine at regular intervals. Do not take your medicine more often than directed. Long-term, continuous use may increase the risk of heart attack or stroke. A special MedGuide will be given to you by the pharmacist with each prescription and refill. Be sure to read this information carefully each time. Talk to your pediatrician regarding the use of this medicine in children. Special care may be needed. Overdosage: If you think you have taken too much of this medicine contact a poison control center or emergency room at once. NOTE: This medicine is only for you. Do not share this medicine with others. What if I miss a dose? If you miss a dose, take it as soon as you can. If it is almost time for your next dose, take only that dose. Do not take double or extra doses. What may interact with this  medicine? -alcohol -aspirin -cidofovir -diuretics -lithium -methotrexate -other drugs for inflammation like ketorolac or prednisone -pemetrexed -probenecid -warfarin This list may not describe all possible interactions. Give your health care provider a list of all the medicines, herbs, non-prescription drugs, or dietary supplements you use. Also tell them if you smoke, drink alcohol, or use illegal drugs. Some items may interact with your medicine. What should I watch for while using this medicine? Tell your doctor or health care professional if your pain does not get better. Talk to your doctor before taking another medicine for pain. Do not treat yourself. This medicine does not prevent heart attack or stroke. In fact, this medicine may increase the chance of a heart attack or stroke. The chance may increase with longer use of this medicine and in people who have heart disease. If you take aspirin to prevent heart attack or stroke, talk with your doctor or health care professional. Do not take other medicines that contain aspirin, ibuprofen, or naproxen with this medicine. Side effects such as stomach upset, nausea, or ulcers may be more likely to occur. Many medicines available without a prescription should not be taken with this medicine. This medicine can cause ulcers and bleeding in the stomach and intestines at any time during treatment. Do not smoke cigarettes or drink alcohol. These increase irritation to your stomach and can make it more susceptible to damage from this medicine.  Ulcers and bleeding can happen without warning symptoms and can cause death. You may get drowsy or dizzy. Do not drive, use machinery, or do anything that needs mental alertness until you know how this medicine affects you. Do not stand or sit up quickly, especially if you are an older patient. This reduces the risk of dizzy or fainting spells. This medicine can cause you to bleed more easily. Try to avoid damage  to your teeth and gums when you brush or floss your teeth. What side effects may I notice from receiving this medicine? Side effects that you should report to your doctor or health care professional as soon as possible: -black or bloody stools, blood in the urine or vomit -blurred vision -chest pain -difficulty breathing or wheezing -nausea or vomiting -severe stomach pain -skin rash, skin redness, blistering or peeling skin, hives, or itching -slurred speech or weakness on one side of the body -swelling of eyelids, throat, lips -unexplained weight gain or swelling -unusually weak or tired -yellowing of eyes or skin Side effects that usually do not require medical attention (report to your doctor or health care professional if they continue or are bothersome): -constipation -headache -heartburn This list may not describe all possible side effects. Call your doctor for medical advice about side effects. You may report side effects to FDA at 1-800-FDA-1088. Where should I keep my medicine? Keep out of the reach of children. Store at room temperature between 15 and 30 degrees C (59 and 86 degrees F). Keep container tightly closed. Throw away any unused medicine after the expiration date. NOTE: This sheet is a summary. It may not cover all possible information. If you have questions about this medicine, talk to your doctor, pharmacist, or health care provider.  2015, Elsevier/Gold Standard. (2009-12-03 20:10:16)

## 2014-07-30 NOTE — ED Notes (Signed)
Pt is asking for something for pain, MD Preston FleetingGlick.

## 2014-08-03 ENCOUNTER — Emergency Department (HOSPITAL_COMMUNITY)
Admission: EM | Admit: 2014-08-03 | Discharge: 2014-08-04 | Disposition: A | Discharge status: Home / Self Care | Payer: Medicaid Other | Attending: Emergency Medicine | Admitting: Emergency Medicine

## 2014-08-03 ENCOUNTER — Encounter (HOSPITAL_COMMUNITY): Payer: Self-pay | Admitting: Emergency Medicine

## 2014-08-03 DIAGNOSIS — F10920 Alcohol use, unspecified with intoxication, uncomplicated: Secondary | ICD-10-CM

## 2014-08-03 DIAGNOSIS — F1010 Alcohol abuse, uncomplicated: Secondary | ICD-10-CM | POA: Insufficient documentation

## 2014-08-03 DIAGNOSIS — Z008 Encounter for other general examination: Secondary | ICD-10-CM | POA: Insufficient documentation

## 2014-08-03 DIAGNOSIS — F17200 Nicotine dependence, unspecified, uncomplicated: Secondary | ICD-10-CM | POA: Insufficient documentation

## 2014-08-03 LAB — CBC WITH DIFFERENTIAL/PLATELET
BASOS ABS: 0.1 10*3/uL (ref 0.0–0.1)
Basophils Relative: 1 % (ref 0–1)
EOS PCT: 1 % (ref 0–5)
Eosinophils Absolute: 0.1 10*3/uL (ref 0.0–0.7)
HEMATOCRIT: 43.1 % (ref 36.0–46.0)
HEMOGLOBIN: 15.2 g/dL — AB (ref 12.0–15.0)
LYMPHS PCT: 36 % (ref 12–46)
Lymphs Abs: 2.6 10*3/uL (ref 0.7–4.0)
MCH: 32.3 pg (ref 26.0–34.0)
MCHC: 35.3 g/dL (ref 30.0–36.0)
MCV: 91.5 fL (ref 78.0–100.0)
MONO ABS: 0.4 10*3/uL (ref 0.1–1.0)
MONOS PCT: 5 % (ref 3–12)
Neutro Abs: 4.3 10*3/uL (ref 1.7–7.7)
Neutrophils Relative %: 57 % (ref 43–77)
Platelets: 310 10*3/uL (ref 150–400)
RBC: 4.71 MIL/uL (ref 3.87–5.11)
RDW: 12.3 % (ref 11.5–15.5)
WBC: 7.4 10*3/uL (ref 4.0–10.5)

## 2014-08-03 LAB — BASIC METABOLIC PANEL
ANION GAP: 17 — AB (ref 5–15)
BUN: 21 mg/dL (ref 6–23)
CALCIUM: 9.5 mg/dL (ref 8.4–10.5)
CO2: 26 mEq/L (ref 19–32)
Chloride: 100 mEq/L (ref 96–112)
Creatinine, Ser: 0.91 mg/dL (ref 0.50–1.10)
GFR calc Af Amer: >90 mL/min (ref >90)
GFR calc non Af Amer: 78 mL/min — ABNORMAL LOW (ref >90)
Glucose, Bld: 102 mg/dL — ABNORMAL HIGH (ref 70–99)
Potassium: 4.3 mEq/L (ref 3.7–5.3)
Sodium: 143 mEq/L (ref 137–147)

## 2014-08-03 LAB — RAPID URINE DRUG SCREEN, HOSP PERFORMED
Amphetamines: NOT DETECTED
BARBITURATES: NOT DETECTED
Benzodiazepines: NOT DETECTED
Cocaine: NOT DETECTED
Opiates: NOT DETECTED
TETRAHYDROCANNABINOL: NOT DETECTED

## 2014-08-03 LAB — PREGNANCY, URINE: Preg Test, Ur: NEGATIVE

## 2014-08-03 LAB — ETHANOL
Alcohol, Ethyl (B): 275 mg/dL — ABNORMAL HIGH (ref 0–11)
Alcohol, Ethyl (B): 193 mg/dL — ABNORMAL HIGH (ref 0–11)
Alcohol, Ethyl (B): 164 mg/dL — ABNORMAL HIGH (ref 0–11)

## 2014-08-03 MED ORDER — ONDANSETRON HCL 4 MG/2ML IJ SOLN
4.0000 mg | Freq: Once | INTRAMUSCULAR | Status: DC
  Filled 2014-08-03: qty 2

## 2014-08-03 MED ORDER — VITAMIN B-1 100 MG PO TABS: 100.0000 mg | ORAL_TABLET | Freq: Every day | ORAL | Status: DC

## 2014-08-03 MED ORDER — LORAZEPAM 2 MG/ML IJ SOLN
0.0000 mg | Freq: Two times a day (BID) | INTRAMUSCULAR | Status: DC
  Filled 2014-08-03: qty 1

## 2014-08-03 MED ORDER — ONDANSETRON HCL 4 MG/2ML IJ SOLN: 4.0000 mg | Freq: Once | INTRAMUSCULAR | Status: AC

## 2014-08-03 MED ORDER — LORAZEPAM 2 MG/ML IJ SOLN
0.0000 mg | Freq: Four times a day (QID) | INTRAMUSCULAR | Status: DC
  Administered 2014-08-03: 2 mg via INTRAVENOUS

## 2014-08-03 MED ORDER — ACETAMINOPHEN 325 MG PO TABS
650.0000 mg | ORAL_TABLET | Freq: Once | ORAL | Status: AC
  Administered 2014-08-03: 650 mg via ORAL
  Filled 2014-08-03: qty 2

## 2014-08-03 MED ORDER — LORAZEPAM 1 MG PO TABS
0.0000 mg | ORAL_TABLET | Freq: Four times a day (QID) | ORAL | Status: DC
  Administered 2014-08-04: 2 mg via ORAL
  Filled 2014-08-03: qty 2

## 2014-08-03 MED ORDER — CALCIUM CARBONATE ANTACID 500 MG PO CHEW
1.0000 | CHEWABLE_TABLET | Freq: Once | ORAL | Status: DC
  Filled 2014-08-03: qty 1

## 2014-08-03 MED ORDER — LORAZEPAM 1 MG PO TABS: 0.0000 mg | ORAL_TABLET | Freq: Two times a day (BID) | ORAL | Status: DC

## 2014-08-03 MED ORDER — SODIUM CHLORIDE 0.9 % IV BOLUS (SEPSIS)
1000.0000 mL | Freq: Once | INTRAVENOUS | Status: AC
  Administered 2014-08-03: 1000 mL via INTRAVENOUS

## 2014-08-03 MED ORDER — ONDANSETRON HCL 4 MG/2ML IJ SOLN: 4.0000 mg | Freq: Once | INTRAMUSCULAR | Status: DC

## 2014-08-03 MED ORDER — THIAMINE HCL 100 MG/ML IJ SOLN
100.0000 mg | Freq: Every day | INTRAMUSCULAR | Status: DC
  Administered 2014-08-03: 100 mg via INTRAVENOUS
  Filled 2014-08-03: qty 2

## 2014-08-03 NOTE — ED Notes (Signed)
Spoke with Andrey CampanileSandy at [facility] to inform her that pt's new alcohol level was 193 and she suggested that pt needed to wait about 2 hours before new draw. Pt has to be transported to [facility] via [facility].

## 2014-08-03 NOTE — ED Notes (Signed)
Pt sent over from [facility] for medical clearance. Pt supposedly has a bed at [facility]. Asked pt how much she has drank and she stated, "Hell, I don't know, blood test me" Pt states she has not eaten in 5 days and has been vomiting.

## 2014-08-03 NOTE — ED Provider Notes (Signed)
CSN: [id number]     Arrival date & time 08/03/14  1500 History  This chart was scribed for Audree Camel, MD by Leone Payor, ED Scribe. This patient was seen in room APA12/APA12 and the patient's care was started 3:30 PM.    Chief Complaint  Patient presents with  . Medical Clearance   The history is provided by the patient. No language interpreter was used.    HPI Comments: Stephanie Parrish is a 38 y.o. female who presents to the Emergency Department requesting medical clearance today. Patient states she is seeking detox from alcohol through [facility] and has a bed available at [facility]. Patient states she has been drinking daily for the past 2 months and her last drink was a pint of vodka 5-6 hours ago. Patient was at [facility] and was told she had a "seizure". She does not recall this but was told she was shaking "really bad" and her eyes "rolled to the back of her head". She does not recall this and states she just felt tired. She reports feeling nauseated and having dry-heaves. She denies SI, HI. Nurse noted patient had a "shaking spell" while in triage and was apparently less awake. She told her to stop shaking and wake up and the patient immediately complied. Nurse notes this did not appear like a seizure.   Past Medical History  Diagnosis Date  . Medical history non-contributory    Past Surgical History  Procedure Laterality Date  . Breast enhancement surgery     No family history on file. History  Substance Use Topics  . Smoking status: Current Every Day Smoker -- 1.00 packs/day  . Smokeless tobacco: Never Used  . Alcohol Use: Yes     Comment: social   OB History   Grav Para Term Preterm Abortions TAB SAB Ect Mult Living   10    4 1 3   6      Review of Systems  Gastrointestinal: Positive for nausea and vomiting.  Psychiatric/Behavioral: Negative for suicidal ideas.  All other systems reviewed and are negative.     Allergies  Review of patient's allergies indicates no  known allergies.  Home Medications   Prior to Admission medications   Not on File   BP 129/111  Pulse 127  Temp(Src) 98.9 F (37.2 C) (Oral)  Resp 18  Ht 5\' 4"  (1.626 m)  Wt 120 lb (54.432 kg)  BMI 20.59 kg/m2  SpO2 95% Physical Exam  Nursing note and vitals reviewed. Constitutional: She is oriented to person, place, and time. She appears well-developed and well-nourished.  Intoxicated.   HENT:  Head: Normocephalic and atraumatic.  Wound with scab and sutures in place on forehead. Right periorbital ecchymosis    Cardiovascular: Normal rate, regular rhythm and normal heart sounds.   Pulmonary/Chest: Effort normal and breath sounds normal.  Abdominal: Soft. Bowel sounds are normal. She exhibits no distension.  Neurological: She is alert and oriented to person, place, and time.  Skin: Skin is warm and dry.  Psychiatric: She has a normal mood and affect.    ED Course  Procedures (including critical care time)  DIAGNOSTIC STUDIES: Oxygen Saturation is 95% on RA, adequate by my interpretation.    COORDINATION OF CARE: 3:37 PM Discussed treatment plan with pt at bedside and pt agreed to plan.   Labs Review Labs Reviewed  ETHANOL - Abnormal; Notable for the following:    Alcohol, Ethyl (B) 275 (*)    All other components within normal  limits  CBC WITH DIFFERENTIAL - Abnormal; Notable for the following:    Hemoglobin 15.2 (*)    All other components within normal limits  BASIC METABOLIC PANEL - Abnormal; Notable for the following:    Glucose, Bld 102 (*)    GFR calc non Af Amer 78 (*)    Anion gap 17 (*)    All other components within normal limits  ETHANOL - Abnormal; Notable for the following:    Alcohol, Ethyl (B) 193 (*)    All other components within normal limits  URINE RAPID DRUG SCREEN (HOSP PERFORMED)  PREGNANCY, URINE  ETHANOL    Imaging Review No results found.   EKG Interpretation None      MDM   Final diagnoses:  ETOH abuse  Alcohol  intoxication, uncomplicated    Patient's ETOH level is apparently higher than when [facility] was [facility], inferring that she drank on her way here or prior to coming here. Unlikely this was a seizure given the description. She also is still drinking which leans away from an alcohol withdrawal seizure.  Patient is accepted to [facility], they required her ETOH level to be below 114. Most recent ETOH is 193, they are requesting recheck in 2 hours. When it is at appropriate level she will be taken there for detox. Care transferred to oncoming physician with plan in place.  I personally performed the services described in this documentation, which was scribed in my presence. The recorded information has been reviewed and is accurate.   Audree CamelScott T Kallen Delatorre, MD 08/03/14 2140

## 2014-08-03 NOTE — ED Notes (Signed)
Patient is asleep at this time. No distress noted. 

## 2014-08-03 NOTE — ED Notes (Signed)
Phoned [facility] to determine if pt has a bed there. I spoke with Lourdes SledgeSandra Edwards and she will be available there until 2330 to answer any further questions. Pts BAC is currently 275 and will have to come down to 114 before she will be able to be accepted. To fax labs as soon as [facility] comes back.

## 2014-08-03 NOTE — ED Notes (Signed)
Pt presents to ED for medical clearance from [facility]. She appears to be alcohol impaired and is somewhat cooperative. NAD.

## 2014-08-03 NOTE — ED Notes (Signed)
Spoke with Berna SpareMarcus: patient must be [facility], no exceptions. Please call report to [phone], prior to [facility] leaving with patient.

## 2014-08-03 NOTE — BH Assessment (Signed)
[facility] Assessment Progress Note   [facility], pending BAL below 140.  Patient will need to come in by way [facility].  [facility] and [facility] to [facility] earlier today.  Clinician spoke to patient.  She had some questions about visitation and smoking policies at [facility].  She denies any SI, HI or A/V hallucinations.  Patient understands that she needs to have lower BAL before she can be transported to [facility].  She asked about boyfriend taking her but this clinician told her that she will not be accepted to [facility] if anyone besides [facility] her.  Patient complained mildly about this but accepted it.  Clinician did call Sandy at [facility] and got the answers to questions patient asked.  Clinician did talk to patient once more.  She will stay at [facility] until BAL is lower.    Sandy at [facility] wanted to make sure that when [facility], that they are called as patient is leaving.  [facility] is at [phone].

## 2014-08-04 LAB — ETHANOL: Alcohol, Ethyl (B): 87 mg/dL — ABNORMAL HIGH (ref 0–11)

## 2014-08-04 MED ORDER — IBUPROFEN 400 MG PO TABS
400.0000 mg | ORAL_TABLET | Freq: Once | ORAL | Status: AC
  Administered 2014-08-04: 400 mg via ORAL
  Filled 2014-08-04: qty 1

## 2014-08-04 MED ORDER — NICOTINE 14 MG/24HR TD PT24
14.0000 mg | MEDICATED_PATCH | Freq: Once | TRANSDERMAL | Status: DC
  Administered 2014-08-04: 14 mg via TRANSDERMAL
  Filled 2014-08-04: qty 1

## 2014-08-04 NOTE — Discharge Instructions (Signed)
Alcohol Use Disorder Alcohol use disorder is a mental disorder. It is not a one-time incident of heavy drinking. Alcohol use disorder is the excessive and uncontrollable use of alcohol over time that leads to problems with functioning in one or more areas of daily living. People with this disorder risk harming themselves and others when they drink to excess. Alcohol use disorder also can cause other mental disorders, such as mood and anxiety disorders, and serious physical problems. People with alcohol use disorder often misuse other drugs.  Alcohol use disorder is common and widespread. Some people with this disorder drink alcohol to cope with or escape from negative life events. Others drink to relieve chronic pain or symptoms of mental illness. People with a family history of alcohol use disorder are at higher risk of losing control and using alcohol to excess.  SYMPTOMS  Signs and symptoms of alcohol use disorder may include the following:   Consumption ofalcohol inlarger amounts or over a longer period of time than intended.  Multiple unsuccessful attempts to cutdown or control alcohol use.   A great deal of time spent obtaining alcohol, using alcohol, or recovering from the effects of alcohol (hangover).  A strong desire or urge to use alcohol (cravings).   Continued use of alcohol despite problems at work, school, or home because of alcohol use.   Continued use of alcohol despite problems in relationships because of alcohol use.  Continued use of alcohol in situations when it is physically hazardous, such as driving a car.  Continued use of alcohol despite awareness of a physical or psychological problem that is likely related to alcohol use. Physical problems related to alcohol use can involve the brain, heart, liver, stomach, and intestines. Psychological problems related to alcohol use include intoxication, depression, anxiety, psychosis, delirium, and dementia.   The need for  increased amounts of alcohol to achieve the same desired effect, or a decreased effect from the consumption of the same amount of alcohol (tolerance).  Withdrawal symptoms upon reducing or stopping alcohol use, or alcohol use to reduce or avoid withdrawal symptoms. Withdrawal symptoms include:  Racing heart.  Hand tremor.  Difficulty sleeping.  Nausea.  Vomiting.  Hallucinations.  Restlessness.  Seizures. DIAGNOSIS Alcohol use disorder is diagnosed through an assessment by your health care provider. Your health care provider may start by asking three or four questions to screen for excessive or problematic alcohol use. To confirm a diagnosis of alcohol use disorder, at least two symptoms must be present within a 12-month period. The severity of alcohol use disorder depends on the number of symptoms:  Mild--two or three.  Moderate--four or five.  Severe--six or more. Your health care provider may perform a physical exam or use results from lab tests to see if you have physical problems resulting from alcohol use. Your health care provider may refer you to a mental health professional for evaluation. TREATMENT  Some people with alcohol use disorder are able to reduce their alcohol use to low-risk levels. Some people with alcohol use disorder need to quit drinking alcohol. When necessary, mental health professionals with specialized training in substance use treatment can help. Your health care provider can help you decide how severe your alcohol use disorder is and what type of treatment you need. The following forms of treatment are available:   Detoxification. Detoxification involves the use of prescription medicines to prevent alcohol withdrawal symptoms in the first week after quitting. This is important for people with a history of symptoms   of withdrawal and for heavy drinkers who are likely to have withdrawal symptoms. Alcohol withdrawal can be dangerous and, in severe cases, cause  death. Detoxification is usually provided in a hospital or in-patient substance use treatment facility.  Counseling or talk therapy. Talk therapy is provided by substance use treatment counselors. It addresses the reasons people use alcohol and ways to keep them from drinking again. The goals of talk therapy are to help people with alcohol use disorder find healthy activities and ways to cope with life stress, to identify and avoid triggers for alcohol use, and to handle cravings, which can cause relapse.  Medicines.Different medicines can help treat alcohol use disorder through the following actions:  Decrease alcohol cravings.  Decrease the positive reward response felt from alcohol use.  Produce an uncomfortable physical reaction when alcohol is used (aversion therapy).  Support groups. Support groups are run by people who have quit drinking. They provide emotional support, advice, and guidance. These forms of treatment are often combined. Some people with alcohol use disorder benefit from intensive combination treatment provided by specialized substance use treatment centers. Both inpatient and outpatient treatment programs are available. Document Released: 01/08/2005 Document Revised: 04/17/2014 Document Reviewed: 03/10/2013 ExitCare Patient Information 2015 [employer]. This information is not intended to replace advice given to you by your health care provider. Make sure you discuss any questions you have with your health care provider.  

## 2014-08-04 NOTE — ED Notes (Signed)
Pt ambulated to and from shower without difficulty.

## 2014-08-04 NOTE — ED Notes (Signed)
Pt last ethanol lab-87, [facility] called to take to [facility], faxed latest lab results to [place] [facility], pt's signed discharge papers and transported to [facility], [facility] w/o any difficulties, paperwork and personal items sent with patient.

## 2014-10-16 ENCOUNTER — Encounter (HOSPITAL_COMMUNITY): Payer: Self-pay | Admitting: Emergency Medicine

## 2014-10-24 ENCOUNTER — Encounter (HOSPITAL_COMMUNITY): Payer: Self-pay | Admitting: Emergency Medicine

## 2014-10-24 ENCOUNTER — Emergency Department (HOSPITAL_COMMUNITY)
Admission: EM | Admit: 2014-10-24 | Discharge: 2014-10-24 | Disposition: A | Discharge status: Home / Self Care | Payer: Medicaid Other | Attending: Emergency Medicine | Admitting: Emergency Medicine

## 2014-10-24 DIAGNOSIS — Z3202 Encounter for pregnancy test, result negative: Secondary | ICD-10-CM | POA: Insufficient documentation

## 2014-10-24 DIAGNOSIS — Z8639 Personal history of other endocrine, nutritional and metabolic disease: Secondary | ICD-10-CM | POA: Insufficient documentation

## 2014-10-24 DIAGNOSIS — Z720 Tobacco use: Secondary | ICD-10-CM | POA: Insufficient documentation

## 2014-10-24 DIAGNOSIS — F1010 Alcohol abuse, uncomplicated: Secondary | ICD-10-CM

## 2014-10-24 HISTORY — DX: Tachycardia, unspecified: R00.0

## 2014-10-24 HISTORY — DX: Pure hypercholesterolemia, unspecified: E78.00

## 2014-10-24 LAB — RAPID URINE DRUG SCREEN, HOSP PERFORMED
AMPHETAMINES: NOT DETECTED
BENZODIAZEPINES: NOT DETECTED
Barbiturates: NOT DETECTED
COCAINE: NOT DETECTED
OPIATES: NOT DETECTED
TETRAHYDROCANNABINOL: NOT DETECTED

## 2014-10-24 LAB — PREGNANCY, URINE: Preg Test, Ur: NEGATIVE

## 2014-10-24 NOTE — ED Provider Notes (Signed)
CSN: [id number]     Arrival date & time 10/24/14  1012 History   First MD Initiated Contact with Patient 10/24/14 1042     Chief Complaint  Patient presents with  . V70.1      The history is provided by the patient.  patient is currently involved in a DSS case and her children foster care.  She showed up last night to the DSS office apparently intoxicated per staff members there.  She was reported to be verbally abusive.  They were concerned about her safety.  She did make some verbal threats to several staff members there.  IVC paperwork was taken out this morning and patient was brought to the ER in sheriff custody.  She denies hallucinations.  No prior psychiatric history.  She does admit to alcohol use.  She smells of alcohol at this time.  Is also reported that she showed up at court this morning intoxicated.  Patient denies alcohol use this morning.  No suicidal or homicidal thoughts.  She has been cooperative here in the ER.  Past Medical History  Diagnosis Date  . Medical history non-contributory   . Tachycardia   . High cholesterol    Past Surgical History  Procedure Laterality Date  . Breast enhancement surgery     History reviewed. No pertinent family history. History  Substance Use Topics  . Smoking status: Current Every Day Smoker -- 1.00 packs/day for 20 years    Types: Cigarettes  . Smokeless tobacco: Never Used  . Alcohol Use: Yes   OB History    Gravida Para Term Preterm AB TAB SAB Ectopic Multiple Living   10    4 1 3   6      Review of Systems  All other systems reviewed and are negative.     Allergies  Review of patient's allergies indicates no known allergies.  Home Medications   Prior to Admission medications   Not on File   BP 136/95 mmHg  Pulse 101  Temp(Src) 98.2 F (36.8 C) (Oral)  Resp 18  Ht 5\' 5"  (1.651 m)  Wt 165 lb (74.844 kg)  BMI 27.46 kg/m2  SpO2 98% Physical Exam  Constitutional: She is oriented to person, place, and time.  She appears well-developed and well-nourished. No distress.  HENT:  Head: Normocephalic and atraumatic.  Eyes: EOM are normal.  Neck: Normal range of motion.  Cardiovascular: Normal rate, regular rhythm and normal heart sounds.   Pulmonary/Chest: Effort normal and breath sounds normal.  Abdominal: Soft. She exhibits no distension. There is no tenderness.  Musculoskeletal: Normal range of motion.  Neurological: She is alert and oriented to person, place, and time.  Skin: Skin is warm and dry.  Psychiatric: She has a normal mood and affect. Judgment normal.  Nursing note and vitals reviewed.   ED Course  Procedures (including critical care time) Labs Review Labs Reviewed  PREGNANCY, URINE  CBC WITH DIFFERENTIAL  COMPREHENSIVE METABOLIC PANEL  URINE RAPID DRUG SCREEN (HOSP PERFORMED)  ETHANOL    Imaging Review No results found.   EKG Interpretation None      MDM   Final diagnoses:  Alcohol abuse    Patient does not appear to be a threat to herself or to others at this time.  I do think she has a overall abuse problem.  I think she can be somewhat verbally abusive but I do not think she would benefit from inpatient psychiatric care at this time.  I think she  is making poor life decisions but I do not believe her to be psychotic.    Lyanne CoKevin M Sumiya Mamaril, MD 10/24/14 1058

## 2014-10-24 NOTE — ED Notes (Signed)
Patient smells of Etoh.

## 2014-10-24 NOTE — ED Notes (Signed)
Dr. Patria Maneampos at bedside.   Pt would not cooperate with phlebotomist.

## 2014-10-24 NOTE — ED Notes (Signed)
[facility]. Patient at court yesterday, was belligerent and threatened DSS. DSS filed commtiment papers. Patient denies any threats. Per IVC papers she is alcoholic, substance abuser. Danger to self and others. Patient uses alcohol everyday. Possibly Borderline personality disorder. Paper states that she goes from crying to cursing. Patient states she has threatened the guardian AD Litem today.

## 2014-10-25 ENCOUNTER — Encounter (HOSPITAL_COMMUNITY): Payer: Self-pay

## 2014-10-25 ENCOUNTER — Emergency Department (HOSPITAL_COMMUNITY)
Admission: EM | Admit: 2014-10-25 | Discharge: 2014-10-25 | Disposition: A | Discharge status: Home / Self Care | Payer: Medicaid Other | Attending: Emergency Medicine | Admitting: Emergency Medicine

## 2014-10-25 DIAGNOSIS — K0381 Cracked tooth: Secondary | ICD-10-CM | POA: Insufficient documentation

## 2014-10-25 DIAGNOSIS — Z720 Tobacco use: Secondary | ICD-10-CM | POA: Insufficient documentation

## 2014-10-25 DIAGNOSIS — Z8639 Personal history of other endocrine, nutritional and metabolic disease: Secondary | ICD-10-CM | POA: Insufficient documentation

## 2014-10-25 DIAGNOSIS — K029 Dental caries, unspecified: Secondary | ICD-10-CM | POA: Insufficient documentation

## 2014-10-25 MED ORDER — TRAMADOL HCL 50 MG PO TABS: 50.0000 mg | ORAL_TABLET | Freq: Four times a day (QID) | ORAL | Status: AC | PRN

## 2014-10-25 MED ORDER — IBUPROFEN 600 MG PO TABS: 600.0000 mg | ORAL_TABLET | Freq: Four times a day (QID) | ORAL | Status: AC | PRN

## 2014-10-25 NOTE — ED Provider Notes (Signed)
CSN: [id number]     Arrival date & time 10/25/14  0902 History   First MD Initiated Contact with Patient 10/25/14 [phone]     Chief Complaint  Patient presents with  . Dental Pain     (Consider location/radiation/quality/duration/timing/severity/associated sxs/prior Treatment) Patient is a 38 y.o. female presenting with tooth pain. The history is provided by the patient.  Dental Pain Associated symptoms: no facial swelling, no fever and no neck pain    Stephanie Calton DachClements is a 38 y.o. female presenting with dental pain which has been present for the past week, worsened x 3 days since biting down on a pretzel causing fracture of her left upper central incisor and also loss of part of the filling of her left upper lateral incisor.  She is currently seeing Dr. Mylinda LatinaVines for her teeth and was already advised she needs a root canal in the lateral incisor tooth.  Her next appointment with him is not for 3 weeks.  She denies fevers or chills and has had no gingival or facial swelling. She has taken advil with no relief of her pain.    Past Medical History  Diagnosis Date  . Medical history non-contributory   . Tachycardia   . High cholesterol    Past Surgical History  Procedure Laterality Date  . Breast enhancement surgery     No family history on file. History  Substance Use Topics  . Smoking status: Current Every Day Smoker -- 1.00 packs/day for 20 years    Types: Cigarettes  . Smokeless tobacco: Never Used  . Alcohol Use: Yes     Comment: occ   OB History    Gravida Para Term Preterm AB TAB SAB Ectopic Multiple Living   10    4 1 3   6      Review of Systems  Constitutional: Negative for fever.  HENT: Positive for dental problem. Negative for facial swelling and sore throat.   Respiratory: Negative for shortness of breath.   Musculoskeletal: Negative for neck pain and neck stiffness.      Allergies  Review of patient's allergies indicates no known allergies.  Home Medications    Prior to Admission medications   Medication Sig Start Date End Date Taking? Authorizing Provider  ibuprofen (ADVIL,MOTRIN) 600 MG tablet Take 1 tablet (600 mg total) by mouth every 6 (six) hours as needed. 10/25/14   Burgess AmorJulie Keiyana Stehr, PA-C  traMADol (ULTRAM) 50 MG tablet Take 1 tablet (50 mg total) by mouth every 6 (six) hours as needed. 10/25/14   Burgess AmorJulie Ayiden Milliman, PA-C   BP 129/94 mmHg  Pulse 108  Temp(Src) 98.2 F (36.8 C) (Oral)  Resp 18  Ht 5\' 5"  (1.651 m)  Wt 168 lb (76.204 kg)  BMI 27.96 kg/m2  SpO2 100% Physical Exam  Constitutional: She is oriented to person, place, and time. She appears well-developed and well-nourished. No distress.  HENT:  Head: Normocephalic and atraumatic.  Right Ear: Tympanic membrane and external ear normal.  Left Ear: Tympanic membrane and external ear normal.  Mouth/Throat: Oropharynx is clear and moist and mucous membranes are normal. No oral lesions. No trismus in the jaw. Dental caries present. No dental abscesses.  Distal tooth fracture of left upper central incisor of enamel only, no pulp exposure.  Left upper lateral incisor has a slightly darker appearance then the surrounding teeth, no obvious fracture.  Gingiva is erythematous without edema.    Eyes: Conjunctivae are normal.  Neck: Normal range of motion. Neck supple.  Cardiovascular:  Normal rate and normal heart sounds.   Pulmonary/Chest: Effort normal.  Abdominal: She exhibits no distension.  Musculoskeletal: Normal range of motion.  Lymphadenopathy:    She has no cervical adenopathy.  Neurological: She is alert and oriented to person, place, and time.  Skin: Skin is warm and dry. No erythema.  Psychiatric: She has a normal mood and affect.    ED Course  Dental Date/Time: 10/25/2014 9:45 AM Performed by: Burgess AmorIDOL, Macaila Tahir Authorized by: Burgess AmorIDOL, Salik Grewell Consent: Verbal consent obtained. Risks and benefits: risks, benefits and alternatives were discussed Consent given by: patient Patient identity  confirmed: verbally with patient Time out: Immediately prior to procedure a "time out" was called to verify the correct patient, procedure, equipment, support staff and site/side marked as required. Local anesthesia used: yes Anesthesia: nerve block Local anesthetic: bupivacaine 0.5% without epinephrine Anesthetic total: 0.5 ml Patient tolerance: Patient tolerated the procedure well with no immediate complications Comments: Pain free after dental block   (including critical care time) Labs Review Labs Reviewed - No data to display  Imaging Review No results found.   EKG Interpretation None      MDM   Final diagnoses:  Dental decay    Dental injury without dental abscess.  She was prescribed tramadol, ibuprofen.  Advised f/u with her dentist for definitive tx.     Burgess AmorJulie Lynnlee Revels, PA-C 10/25/14 1008  Burgess AmorJulie Evens Meno, PA-C 10/25/14 1027  Ward GivensIva L Knapp, MD 10/25/14 1110

## 2014-10-25 NOTE — Discharge Instructions (Signed)
Dental Caries °Dental caries (also called tooth decay) is the most common oral disease. It can occur at any age but is more common in children and young adults.  °HOW DENTAL CARIES DEVELOPS  °The process of decay begins when bacteria and foods (particularly sugars and starches) combine in your mouth to produce plaque. Plaque is a substance that sticks to the hard, outer surface of a tooth (enamel). The bacteria in plaque produce acids that attack enamel. These acids may also attack the root surface of a tooth (cementum) if it is exposed. Repeated attacks dissolve these surfaces and create holes in the tooth (cavities). If left untreated, the acids destroy the other layers of the tooth.  °RISK FACTORS °· Frequent sipping of sugary beverages.   °· Frequent snacking on sugary and starchy foods, especially those that easily get stuck in the teeth.   °· Poor oral hygiene.   °· Dry mouth.   °· Substance abuse such as methamphetamine abuse.   °· Broken or poor-fitting dental restorations.   °· Eating disorders.   °· Gastroesophageal reflux disease (GERD).   °· Certain radiation treatments to the head and neck. °SYMPTOMS °In the early stages of dental caries, symptoms are seldom present. Sometimes white, chalky areas may be seen on the enamel or other tooth layers. In later stages, symptoms may include: °· Pits and holes on the enamel. °· Toothache after sweet, hot, or cold foods or drinks are consumed. °· Pain around the tooth. °· Swelling around the tooth. °DIAGNOSIS  °Most of the time, dental caries is detected during a regular dental checkup. A diagnosis is made after a thorough medical and dental history is taken and the surfaces of your teeth are checked for signs of dental caries. Sometimes special instruments, such as lasers, are used to check for dental caries. Dental X-ray exams may be taken so that areas not visible to the eye (such as between the contact areas of the teeth) can be checked for cavities.    °TREATMENT  °If dental caries is in its early stages, it may be reversed with a fluoride treatment or an application of a remineralizing agent at the dental office. Thorough brushing and flossing at home is needed to aid these treatments. If it is in its later stages, treatment depends on the location and extent of tooth destruction:  °· If a small area of the tooth has been destroyed, the destroyed area will be removed and cavities will be filled with a material such as gold, silver amalgam, or composite resin.   °· If a large area of the tooth has been destroyed, the destroyed area will be removed and a cap (crown) will be fitted over the remaining tooth structure.   °· If the center part of the tooth (pulp) is affected, a procedure called a root canal will be needed before a filling or crown can be placed.   °· If most of the tooth has been destroyed, the tooth may need to be pulled (extracted). °HOME CARE INSTRUCTIONS °You can prevent, stop, or reverse dental caries at home by practicing good oral hygiene. Good oral hygiene includes: °· Thoroughly cleaning your teeth at least twice a day with a toothbrush and dental floss.   °· Using a fluoride toothpaste. A fluoride mouth rinse may also be used if recommended by your dentist or health care provider.   °· Restricting the amount of sugary and starchy foods and sugary liquids you consume.   °· Avoiding frequent snacking on these foods and sipping of these liquids.   °· Keeping regular visits with   a dentist for checkups and cleanings. PREVENTION   Practice good oral hygiene.  Consider a dental sealant. A dental sealant is a coating material that is applied by your dentist to the pits and grooves of teeth. The sealant prevents food from being trapped in them. It may protect the teeth for several years.  Ask about fluoride supplements if you live in a community without fluorinated water or with water that has a low fluoride content. Use fluoride supplements  as directed by your dentist or health care provider.  Allow fluoride varnish applications to teeth if directed by your dentist or health care provider. Document Released: 08/23/2002 Document Revised: 04/17/2014 Document Reviewed: 12/03/2012 [facility] Patient Information 2015 [employer] [place]. This information is not intended to replace advice given to you by your health care provider. Make sure you discuss any questions you have with your health care provider.   Complete your entire course of antibiotics as prescribed.  You  may use the tramadol for pain relief but do not drive within 4 hours of taking as this will make you drowsy.  Avoid applying heat or ice to your mouth which can worsen your symptoms.  You may use warm salt water swish and spit treatment or half peroxide and water swish and spit after meals to keep this area clean as discussed.  Call your dentist for further management of your dental concerns.

## 2014-10-25 NOTE — ED Notes (Signed)
Pt c/o toothache x 2 weeks.

## 2014-10-26 ENCOUNTER — Encounter (HOSPITAL_COMMUNITY): Payer: Self-pay | Admitting: *Deleted

## 2014-10-26 ENCOUNTER — Emergency Department (HOSPITAL_COMMUNITY)
Admission: EM | Admit: 2014-10-26 | Discharge: 2014-10-27 | Disposition: A | Discharge status: Home / Self Care | Payer: Medicaid Other | Attending: Emergency Medicine | Admitting: Emergency Medicine

## 2014-10-26 DIAGNOSIS — Z8639 Personal history of other endocrine, nutritional and metabolic disease: Secondary | ICD-10-CM | POA: Insufficient documentation

## 2014-10-26 DIAGNOSIS — F1010 Alcohol abuse, uncomplicated: Secondary | ICD-10-CM | POA: Insufficient documentation

## 2014-10-26 DIAGNOSIS — Z720 Tobacco use: Secondary | ICD-10-CM | POA: Insufficient documentation

## 2014-10-26 DIAGNOSIS — Z3202 Encounter for pregnancy test, result negative: Secondary | ICD-10-CM | POA: Insufficient documentation

## 2014-10-26 HISTORY — DX: Alcohol abuse, uncomplicated: F10.10

## 2014-10-26 LAB — CBC WITH DIFFERENTIAL/PLATELET
BASOS PCT: 1 % (ref 0–1)
Basophils Absolute: 0.1 10*3/uL (ref 0.0–0.1)
EOS ABS: 0.1 10*3/uL (ref 0.0–0.7)
Eosinophils Relative: 1 % (ref 0–5)
HCT: 42.4 % (ref 36.0–46.0)
Hemoglobin: 15 g/dL (ref 12.0–15.0)
Lymphocytes Relative: 36 % (ref 12–46)
Lymphs Abs: 2.7 10*3/uL (ref 0.7–4.0)
MCH: 32.8 pg (ref 26.0–34.0)
MCHC: 35.4 g/dL (ref 30.0–36.0)
MCV: 92.6 fL (ref 78.0–100.0)
Monocytes Absolute: 0.4 10*3/uL (ref 0.1–1.0)
Monocytes Relative: 6 % (ref 3–12)
NEUTROS PCT: 56 % (ref 43–77)
Neutro Abs: 4.4 10*3/uL (ref 1.7–7.7)
PLATELETS: 260 10*3/uL (ref 150–400)
RBC: 4.58 MIL/uL (ref 3.87–5.11)
RDW: 13 % (ref 11.5–15.5)
WBC: 7.6 10*3/uL (ref 4.0–10.5)

## 2014-10-26 LAB — RAPID URINE DRUG SCREEN, HOSP PERFORMED
AMPHETAMINES: NOT DETECTED
Barbiturates: NOT DETECTED
Benzodiazepines: NOT DETECTED
Cocaine: NOT DETECTED
Opiates: NOT DETECTED
TETRAHYDROCANNABINOL: NOT DETECTED

## 2014-10-26 LAB — BASIC METABOLIC PANEL
Anion gap: 16 — ABNORMAL HIGH (ref 5–15)
BUN: 14 mg/dL (ref 6–23)
CO2: 26 mEq/L (ref 19–32)
Calcium: 9.3 mg/dL (ref 8.4–10.5)
Chloride: 101 mEq/L (ref 96–112)
Creatinine, Ser: 0.69 mg/dL (ref 0.50–1.10)
GFR calc Af Amer: >90 mL/min (ref >90)
Glucose, Bld: 106 mg/dL — ABNORMAL HIGH (ref 70–99)
POTASSIUM: 4.1 meq/L (ref 3.7–5.3)
SODIUM: 143 meq/L (ref 137–147)

## 2014-10-26 LAB — URINALYSIS, ROUTINE W REFLEX MICROSCOPIC
Bilirubin Urine: NEGATIVE
Glucose, UA: NEGATIVE mg/dL
KETONES UR: NEGATIVE mg/dL
NITRITE: NEGATIVE
Protein, ur: NEGATIVE mg/dL
SPECIFIC GRAVITY, URINE: 1.01 (ref 1.005–1.030)
UROBILINOGEN UA: 0.2 mg/dL (ref 0.0–1.0)
pH: 5.5 (ref 5.0–8.0)

## 2014-10-26 LAB — PREGNANCY, URINE: PREG TEST UR: NEGATIVE

## 2014-10-26 LAB — ETHANOL: ALCOHOL ETHYL (B): 219 mg/dL — AB (ref 0–11)

## 2014-10-26 LAB — URINE MICROSCOPIC-ADD ON

## 2014-10-26 MED ORDER — THIAMINE HCL 100 MG/ML IJ SOLN: 100.0000 mg | Freq: Every day | INTRAMUSCULAR | Status: DC

## 2014-10-26 MED ORDER — ALUM & MAG HYDROXIDE-SIMETH 200-200-20 MG/5ML PO SUSP
30.0000 mL | ORAL | Status: DC | PRN
  Administered 2014-10-26: 30 mL via ORAL
  Filled 2014-10-26: qty 30

## 2014-10-26 MED ORDER — VITAMIN B-1 100 MG PO TABS
100.0000 mg | ORAL_TABLET | Freq: Every day | ORAL | Status: DC
  Administered 2014-10-27: 100 mg via ORAL
  Filled 2014-10-26: qty 1

## 2014-10-26 MED ORDER — LORAZEPAM 1 MG PO TABS: 0.0000 mg | ORAL_TABLET | Freq: Two times a day (BID) | ORAL | Status: DC

## 2014-10-26 MED ORDER — NICOTINE 21 MG/24HR TD PT24
21.0000 mg | MEDICATED_PATCH | Freq: Every day | TRANSDERMAL | Status: DC | PRN
  Administered 2014-10-26: 21 mg via TRANSDERMAL
  Filled 2014-10-26: qty 1

## 2014-10-26 MED ORDER — LORAZEPAM 1 MG PO TABS
0.0000 mg | ORAL_TABLET | Freq: Four times a day (QID) | ORAL | Status: DC
  Administered 2014-10-26 (×2): 2 mg via ORAL
  Filled 2014-10-26 (×2): qty 2

## 2014-10-26 NOTE — BH Assessment (Signed)
[facility] Assessment Progress Note  Spoke with Dr. Clarene DukeMcManus and took history of pt, spoke with ED staff and set TTS appt for 3:05.

## 2014-10-26 NOTE — ED Notes (Signed)
Pt used the phone this evening, wasn't able to contact anyone d/t not knowing numbers, told pt we couldn't go in belongings to retrieve cell phone.

## 2014-10-26 NOTE — ED Notes (Signed)
Telepsych being completed.  

## 2014-10-26 NOTE — ED Notes (Signed)
Phone taken from pt. Pt cussing,talking loudly and becoming increasingly agitated. Pt told to get off phone and pt remained talking. Phone taken from pt. Person on the phone that pt was talking to, was informed that pt is not allowed to talk on the phone if becomes agitated.

## 2014-10-26 NOTE — ED Provider Notes (Signed)
CSN: [id number]     Arrival date & time 10/26/14  1321 History   First MD Initiated Contact with Patient 10/26/14 1402     Chief Complaint  Patient presents with  . V70.1      HPI Pt was seen at 1415. Per Police, pt and IVC paperwork: pt brought in by Police under IVC by her friend for "making threats," "being violent," "not taking her medications," being "homicidal and suicidal," as well as "abusing drugs." Pt endorses hx of "binge drinking" but denies IVC allegations, stating "my friend is just getting back at me." Pt herself currently denies SI, HI, hallucinations. Pt was evaluated in the ED 2 days ago for same complaint (under IVC) and discharged without speaking to psychiatric team. Pt states "just do that again" to ED staff after her arrival.    Past Medical History  Diagnosis Date  . Tachycardia   . High cholesterol   . Alcohol abuse    Past Surgical History  Procedure Laterality Date  . Breast enhancement surgery      History  Substance Use Topics  . Smoking status: Current Every Day Smoker -- 1.00 packs/day for 20 years    Types: Cigarettes  . Smokeless tobacco: Never Used  . Alcohol Use: Yes     Comment: occ   OB History    Gravida Para Term Preterm AB TAB SAB Ectopic Multiple Living   10    4 1 3   6      Review of Systems ROS: Statement: All systems negative except as marked or noted in the HPI; Constitutional: Negative for fever and chills. ; ; Eyes: Negative for eye pain, redness and discharge. ; ; ENMT: Negative for ear pain, hoarseness, nasal congestion, sinus pressure and sore throat. ; ; Cardiovascular: Negative for chest pain, palpitations, diaphoresis, dyspnea and peripheral edema. ; ; Respiratory: Negative for cough, wheezing and stridor. ; ; Gastrointestinal: Negative for nausea, vomiting, diarrhea, abdominal pain, blood in stool, hematemesis, jaundice and rectal bleeding. . ; ; Genitourinary: Negative for dysuria, flank pain and hematuria. ; ;  Musculoskeletal: Negative for back pain and neck pain. Negative for swelling and trauma.; ; Skin: Negative for pruritus, rash, abrasions, blisters, bruising and skin lesion.; ; Neuro: Negative for headache, lightheadedness and neck stiffness. Negative for weakness, altered level of consciousness , altered mental status, extremity weakness, paresthesias, involuntary movement, seizure and syncope.; Psych:  Pt denies SI, SA, HI, and hallucinations.       Allergies  Review of patient's allergies indicates no known allergies.  Home Medications   Prior to Admission medications   Medication Sig Start Date End Date Taking? Authorizing Provider  ibuprofen (ADVIL,MOTRIN) 600 MG tablet Take 1 tablet (600 mg total) by mouth every 6 (six) hours as needed. 10/25/14   Burgess Amor, PA-C  traMADol (ULTRAM) 50 MG tablet Take 1 tablet (50 mg total) by mouth every 6 (six) hours as needed. 10/25/14   Burgess Amor, PA-C   BP 123/96 mmHg  Pulse 124  Temp(Src) 97.5 F (36.4 C) (Oral)  Resp 20  Ht 5\' 5"  (1.651 m)  Wt 165 lb (74.844 kg)  BMI 27.46 kg/m2  SpO2 96% Physical Exam  1420: Physical examination:  Nursing notes reviewed; Vital signs and O2 SAT reviewed;  Constitutional: Well developed, Well nourished, Well hydrated, In no acute distress; Head:  Normocephalic, atraumatic; Eyes: EOMI, PERRL, No scleral icterus; ENMT: Mouth and pharynx normal, Mucous membranes moist; Neck: Supple, Full range of motion, No lymphadenopathy; Cardiovascular:  Tachycardic rate and rhythm, No gallop; Respiratory: Breath sounds clear & equal bilaterally, No wheezes.  Speaking full sentences with ease, Normal respiratory effort/excursion; Chest: Nontender, Movement normal; Abdomen: Soft, Normal bowel sounds; Extremities: Pulses normal, No tenderness, No edema, No calf edema or asymmetry.; Neuro: AA&Ox3, Major CN grossly intact.  Speech clear. No gross focal motor or sensory deficits in extremities. Climbs on and off stretcher easily by  herself. Gait steady.; Skin: Color normal, Warm, Dry.; Psych:  Guarded.     ED Course  Procedures     EKG Interpretation None      MDM  MDM Reviewed: previous chart, nursing note and vitals Reviewed previous: labs Interpretation: labs     Results for orders placed or performed during the hospital encounter of 10/26/14  Urine rapid drug screen (hosp performed)  Result Value Ref Range   Opiates NONE DETECTED NONE DETECTED   Cocaine NONE DETECTED NONE DETECTED   Benzodiazepines NONE DETECTED NONE DETECTED   Amphetamines NONE DETECTED NONE DETECTED   Tetrahydrocannabinol NONE DETECTED NONE DETECTED   Barbiturates NONE DETECTED NONE DETECTED  Ethanol  Result Value Ref Range   Alcohol, Ethyl (B) 219 (H) 0 - 11 mg/dL  Pregnancy, urine  Result Value Ref Range   Preg Test, Ur NEGATIVE NEGATIVE  Urinalysis, Routine w reflex microscopic  Result Value Ref Range   Color, Urine YELLOW YELLOW   APPearance CLEAR CLEAR   Specific Gravity, Urine 1.010 1.005 - 1.030   pH 5.5 5.0 - 8.0   Glucose, UA NEGATIVE NEGATIVE mg/dL   Hgb urine dipstick MODERATE (A) NEGATIVE   Bilirubin Urine NEGATIVE NEGATIVE   Ketones, ur NEGATIVE NEGATIVE mg/dL   Protein, ur NEGATIVE NEGATIVE mg/dL   Urobilinogen, UA 0.2 0.0 - 1.0 mg/dL   Nitrite NEGATIVE NEGATIVE   Leukocytes, UA TRACE (A) NEGATIVE  CBC with Differential  Result Value Ref Range   WBC 7.6 4.0 - 10.5 K/uL   RBC 4.58 3.87 - 5.11 MIL/uL   Hemoglobin 15.0 12.0 - 15.0 g/dL   HCT 16.142.4 09.636.0 - 04.546.0 %   MCV 92.6 78.0 - 100.0 fL   MCH 32.8 26.0 - 34.0 pg   MCHC 35.4 30.0 - 36.0 g/dL   RDW 40.913.0 81.111.5 - 91.415.5 %   Platelets 260 150 - 400 K/uL   Neutrophils Relative % 56 43 - 77 %   Neutro Abs 4.4 1.7 - 7.7 K/uL   Lymphocytes Relative 36 12 - 46 %   Lymphs Abs 2.7 0.7 - 4.0 K/uL   Monocytes Relative 6 3 - 12 %   Monocytes Absolute 0.4 0.1 - 1.0 K/uL   Eosinophils Relative 1 0 - 5 %   Eosinophils Absolute 0.1 0.0 - 0.7 K/uL   Basophils  Relative 1 0 - 1 %   Basophils Absolute 0.1 0.0 - 0.1 K/uL  Basic metabolic panel  Result Value Ref Range   Sodium 143 137 - 147 mEq/L   Potassium 4.1 3.7 - 5.3 mEq/L   Chloride 101 96 - 112 mEq/L   CO2 26 19 - 32 mEq/L   Glucose, Bld 106 (H) 70 - 99 mg/dL   BUN 14 6 - 23 mg/dL   Creatinine, Ser 7.820.69 0.50 - 1.10 mg/dL   Calcium 9.3 8.4 - 95.610.5 mg/dL   GFR calc non Af Amer >90 >90 mL/min   GFR calc Af Amer >90 >90 mL/min   Anion gap 16 (H) 5 - 15  Urine microscopic-add on  Result Value Ref Range  Squamous Epithelial / LPF RARE RARE   WBC, UA 0-2 <3 WBC/hpf   RBC / HPF 0-2 <3 RBC/hpf   Bacteria, UA RARE RARE    1500:  Pt's 2nd ED visit in 3 days for the same complaint (and under IVC). Will have TTS evaluate pt.    1820:  TSS has evaluated pt and spoken with pt's friends: psychiatric admission is recommended, they will seek placement. IVC continues. Holding orders written.   Samuel JesterKathleen Hamna Asa, DO 10/26/14 2157

## 2014-10-26 NOTE — ED Notes (Signed)
Pt came out of room, began dry heaving over a trash can. Pt would not go back into her room initially. Pt back in her room but remains agitated and taking loudly. Pt reports "there is not nearly enough secretary here for me." Pt talking over ED staff and will not listen to instructions. Pt uncooperative and becoming increasingly loud and agitated. [employer] called to sit with patient. Security remains at bedside.

## 2014-10-26 NOTE — ED Notes (Signed)
[employer] at bedside 

## 2014-10-26 NOTE — ED Notes (Signed)
Pt calm and cooperative. Asleep at this time. [employer] officer signed off on. Charge RN aware.

## 2014-10-26 NOTE — ED Notes (Signed)
Pt brought in by Deputy in cuffs. With IVC papers.

## 2014-10-26 NOTE — ED Notes (Signed)
[facility] to reach out to pt friend to investigate pt's case further. No disposition is set at this time.

## 2014-10-26 NOTE — ED Notes (Addendum)
Pt very sarcastic and mildly agitated. Pt reports is hungry and where is my dinner. Pt informed that cafeteria is running behind and is unsure what time food will be provided but order was placed earlier today. Pt given graham crackers and water. Pt reports wants to make a phone call. Pt informed of phone policy. Pt mildly agitated but verbalized understanding.

## 2014-10-26 NOTE — ED Notes (Signed)
Pt has c/o of nausea, racing heart, sweating, vital signs obtained, informed MD Zammit, he state to give scheduled Ativan, pt CIWA assessment score was 14, given 2 mg of Ativan.

## 2014-10-26 NOTE — BH Assessment (Signed)
[facility]  Dr Jannifer FranklinAkintayo recommends IP treatment for further evaluation and follow up due to pt being a danger to herself with drinking and a danger to others by making threats and having poor judgement.  TTS will look for an appropriate bed.

## 2014-10-26 NOTE — ED Notes (Signed)
[facility] called and reported would be doing telepsych soon. Telepsych monitor at bedside.

## 2014-10-26 NOTE — ED Notes (Signed)
Pt refusing vitals signs. Pt reports "i just want to be left alone.what can you people possibly do for me." pt tearful and uncooperative. Pt reports "there is nothing wrong with me." Pt reports occasional ETOH use but denies ever making any threats or having thoughts of hurting anyone. Pt reports "my friend is the one who is strung out on drugs and got mad at me and took out these papers." EDP aware. No new orders given at this time.

## 2014-10-26 NOTE — BH Assessment (Addendum)
Tele Assessment Note   Stephanie Parrish is an 38 y.o. female who was brought under IVC to [facility].  Pt was put under IVC by her friend Darl Pikes after also being put under IVC by DSS Tuesday for showing up to court for a hearing about her children intoxicated. Pt states that she drinks a couple of times per week, and that she probably shouldn't drink like that but that it is not a huge problem.  She is extremely angry and irritable and does not know why she is under IVC.  She constantly denies SI, HI, A/V hallucinations, and denies SA. She is negative on her UDS, but has ETOH level over 200 even though she says her last drink was yesterday.  Pt had difficulty cooperating and giving history due to being extremely angry at her friends for taking out IVC on her. She states that her friend uses drugs herself, and has no business taking papers out on someone else.  Pt refuses to give much more history, but says she has a BF and that she lives with her two daughters, but apparently, they are actually in foster care.  Spoke with pt's friend Loni Muse, who says he is her "on again off again BF". He states that she needs help for her drinking problems, but that she refuses to get it.  He said that she will occasionally make threats towards him and others (including DSS worker named Irving Burton) when she gets upset, and he says that her friends are trying to help her.  He states that she drinks in huge excesses, and has 3 DUIs.  Spoke to DSS worker Marliss Coots, who says that pt has been threatening others this week including the [employer] for her children, the DSS worker, and she feels that she is a danger to herself and others. She said that pt was walking down the middle of the road after leaving DSS the other day in the middle of traffic, highly intoxicated, not looking where she was going.  Attempted to call pt's friend who took out IVC, Alroy Dust at [phone].  Disposition pending more collateral information and psych  consultation.   Axis I: Alcohol Abuse Axis II: Deferred Axis III:  Past Medical History  Diagnosis Date  . Tachycardia   . High cholesterol   . Alcohol abuse    Axis IV: housing problems, other psychosocial or environmental problems, problems related to legal system/crime and problems with primary support group Axis V: 31-40 impairment in reality testing  Past Medical History:  Past Medical History  Diagnosis Date  . Tachycardia   . High cholesterol   . Alcohol abuse     Past Surgical History  Procedure Laterality Date  . Breast enhancement surgery      Family History: History reviewed. No pertinent family history.  Social History:  reports that she has been smoking Cigarettes.  She has a 20 pack-year smoking history. She has never used smokeless tobacco. She reports that she drinks alcohol. She reports that she does not use illicit drugs.  Additional Social History:  Alcohol / Drug Use Pain Medications: denies Prescriptions: denies Over the Counter: denies History of alcohol / drug use?: Yes Longest period of sobriety (when/how long): 3 years Negative Consequences of Use: Legal Withdrawal Symptoms:  (denies) Substance #1 Name of Substance 1: alcohol 1 - Age of First Use:  (pt refused to say) 1 - Amount (size/oz): pt refused to say 1 - Frequency: 2x/week 1 - Duration: years 1 -  Last Use / Amount: yesterday 3 drinks  CIWA: CIWA-Ar BP: 123/96 mmHg Pulse Rate: (!) 124 COWS:    PATIENT STRENGTHS: (choose at least two) Active sense of humor Average or above average intelligence Capable of independent living Supportive family/friends  Allergies: No Known Allergies  Home Medications:  (Not in a hospital admission)  OB/GYN Status:  No LMP recorded. Patient is not currently having periods (Reason: IUD).  General Assessment Data Is this a Tele or Face-to-Face Assessment?: Tele Assessment Is this an Initial Assessment or a Re-assessment for this encounter?:  Initial Assessment Living Arrangements: Alone Can pt return to current living arrangement?: Yes Admission Status: Involuntary Is patient capable of signing voluntary admission?: Yes Transfer from: Home Referral Source:  (law enforcement)     [facility] Living Arrangements: Alone Name of Therapist: [facility]ibb[facility]  Education Status Is patient currently in school?: No  Risk to self with the past 6 months Suicidal Ideation: No Suicidal Intent: No Is patient at risk for suicide?: No Suicidal Plan?: No Access to Means: No What has been your use of drugs/alcohol within the last 12 months?:  (yesterday) Previous Attempts/Gestures: No Intentional Self Injurious Behavior: None Family Suicide History: No Recent stressful life event(s): Financial Problems, Legal Issues Depression: Yes Depression Symptoms: Feeling angry/irritable, Loss of interest in usual pleasures Substance abuse history and/or treatment for substance abuse?: Yes Suicide prevention information given to non-admitted patients: Not applicable  Risk to Others within the past 6 months Homicidal Ideation: No Thoughts of Harm to Others: No Current Homicidal Intent: No Current Homicidal Plan: No Access to Homicidal Means: No History of harm to others?: No Assessment of Violence: None Noted Does patient have access to weapons?: No Criminal Charges Pending?: No Does patient have a court date: No  Psychosis Hallucinations: None noted Delusions: None noted  Mental Status Report Appear/Hygiene: Disheveled, In scrubs Eye Contact: Poor Motor Activity: Unremarkable Speech: Logical/coherent Level of Consciousness: Alert Mood: Irritable, Angry Affect: Angry, Irritable Anxiety Level: Moderate Thought Processes: Coherent, Relevant Judgement: Impaired Orientation: Person, Place, Time, Situation Obsessive Compulsive Thoughts/Behaviors: None  Cognitive Functioning Concentration: Decreased Memory: Recent Intact, Remote  Intact IQ: Average Insight: Poor Impulse Control: Poor Appetite: Fair Weight Loss: 0 Weight Gain: 0 Sleep: Decreased Total Hours of Sleep: 8 Vegetative Symptoms: None  ADLScreening [facility]) Patient's cognitive ability adequate to safely complete daily activities?: Yes Patient able to express need for assistance with ADLs?: Yes Independently performs ADLs?: Yes (appropriate for developmental age)  Prior Inpatient Therapy Prior Inpatient Therapy: No  Prior Outpatient Therapy Prior Outpatient Therapy: Yes Prior Therapy Dates: 6 months Prior Therapy Facilty/Provider(s):  Erskine Squibb(Jane) Reason for Treatment:  (Pt refused)  ADL Screening (condition at time of admission) Patient's cognitive ability adequate to safely complete daily activities?: Yes Is the patient deaf or have difficulty hearing?: No Does the patient have difficulty seeing, even when wearing glasses/contacts?: No Does the patient have difficulty concentrating, remembering, or making decisions?: No Patient able to express need for assistance with ADLs?: Yes Does the patient have difficulty dressing or bathing?: No Independently performs ADLs?: Yes (appropriate for developmental age) Does the patient have difficulty walking or climbing stairs?: No       Abuse/Neglect Assessment (Assessment to be complete while patient is alone) Physical Abuse: Denies Verbal Abuse: Denies Sexual Abuse: Denies Exploitation of patient/patient's resources: Denies Self-Neglect: Denies     Merchant navy officerAdvance Directives (For Healthcare) Does patient have an advance directive?: No Would patient like information on creating an advanced directive?:  No - patient declined information    Additional Information 1:1 In Past 12 Months?: No CIRT Risk: Yes Elopement Risk: No Does patient have medical clearance?: Yes     Disposition:  Disposition Initial Assessment Completed for this Encounter: Yes Disposition of Patient: Other  dispositions (pending psych consultation)  [facility],Matheo Rathbone Hines 10/26/2014 3:52 PM

## 2014-10-27 DIAGNOSIS — F1014 Alcohol abuse with alcohol-induced mood disorder: Secondary | ICD-10-CM

## 2014-10-27 DIAGNOSIS — F1010 Alcohol abuse, uncomplicated: Secondary | ICD-10-CM | POA: Insufficient documentation

## 2014-10-27 DIAGNOSIS — F4325 Adjustment disorder with mixed disturbance of emotions and conduct: Secondary | ICD-10-CM

## 2014-10-27 DIAGNOSIS — F1994 Other psychoactive substance use, unspecified with psychoactive substance-induced mood disorder: Secondary | ICD-10-CM

## 2014-10-27 MED ORDER — IBUPROFEN 400 MG PO TABS
600.0000 mg | ORAL_TABLET | Freq: Three times a day (TID) | ORAL | Status: DC | PRN
  Administered 2014-10-27: 600 mg via ORAL
  Filled 2014-10-27: qty 2

## 2014-10-27 MED ORDER — ONDANSETRON HCL 4 MG PO TABS: 4.0000 mg | ORAL_TABLET | Freq: Three times a day (TID) | ORAL | Status: DC | PRN

## 2014-10-27 MED ORDER — ACETAMINOPHEN 325 MG PO TABS
650.0000 mg | ORAL_TABLET | ORAL | Status: DC | PRN
  Administered 2014-10-27: 650 mg via ORAL
  Filled 2014-10-27: qty 2

## 2014-10-27 NOTE — BH Assessment (Signed)
[facility] Assessment Progress Note   Clinician called the following [facility] that have no beds[facility] do not work with dual diagnosis patients since they have no SA program. [facility] to [facility].

## 2014-10-27 NOTE — ED Notes (Signed)
TTS monitor placed in room. Pt aware of consult.

## 2014-10-27 NOTE — ED Provider Notes (Addendum)
7:21 AM  Pt asking to speak to physician.  She is calm, cooperative, in NAD.  Asking to talk to TTS again regarding placement.  States she was offered a bed at an outpt rehab facility that her [facility] would pay for.  States she was very intoxicated when she spoke with TTS before and would like to talk to them again.  Will make charge nurse aware.  10:50 AM  Spoke with Renata Capriceonrad with psych.  He has reassess the patient and feels that she is not safe to be discharged home. He recommends that we recent her IVC paperwork. She has outpatient resources in place.  Layla MawKristen N Auriella Wieand, DO 10/27/14 [id number]  Layla MawKristen N Annora Guderian, DO 10/27/14 [phone]

## 2014-10-27 NOTE — ED Notes (Signed)
Pt's CIWA score 0, will evaluated once pt is awake.

## 2014-10-27 NOTE — ED Notes (Addendum)
CIWA-Alchol Scale score 0, No ativan given; order perimeters not met. Patient given her belongings and dressed at this time. Patient states all belongings are present in from of myself and Officer Willis.

## 2014-10-27 NOTE — Consult Note (Signed)
Telepsych Consultation   Reason for Consult:  IVC for possible HI and SI (pt was intoxicated when admitted) Referring Physician: EDP Stephanie Parrish is an 39 y.o. female.  Assessment: AXIS I:  Adjustment Disorder with Mixed Disturbance of Emotions and Conduct, Alcohol Abuse and Substance Induced Mood Disorder AXIS II:  Deferred AXIS III:   Past Medical History  Diagnosis Date  . Tachycardia   . High cholesterol   . Alcohol abuse    AXIS IV:  other psychosocial or environmental problems, problems related to legal system/crime and problems related to social environment AXIS V:  61-70 mild symptoms  Plan:  No evidence of imminent risk to self or others at present.   Patient does not meet criteria for psychiatric inpatient admission. Supportive therapy provided about ongoing stressors. Refer to IOP. Discussed crisis plan, support from social network, calling 911, coming to the Emergency Department, and calling Suicide Hotline. Pt [facility].  Subjective:   Stephanie Parrish is a 38 y.o. female patient admitted with reports of possible suicidal and homicidal ideation although she was intoxicated when assessed. Pt currently denies SI, HI, and AVH and nursing reports affirm the same. Pt is calm, cooperative, and alert/oriented x4 with subjective reporting matching objective findings. Pt wants to go to alcohol rehab as she states she relapsed from being sober. Pt states that her main stressor has been her recent court date and that this inspired her to drink alcohol again and is what initiated the chain of events that brought her to the Emergency Department. Pt may discharge with IVC rescinded.  HPI:  Pt was seen at 1415. Per Police, pt and IVC paperwork: pt brought in by Police under IVC by her friend for "making threats," "being violent," "not taking her medications," being "homicidal and suicidal," as well as "abusing drugs." Pt endorses hx of "binge  drinking" but denies IVC allegations, stating "my friend is just getting back at me." Pt herself currently denies SI, HI, hallucinations. Pt was evaluated in the ED 2 days ago for same complaint (under IVC) and discharged without speaking to psychiatric team. Pt states "just do that again" to ED staff after her arrival.   HPI Elements:   Location:  Generalized, [facility]. Quality:  Stable, improving. Severity:  Severe. Timing:  Intermittent. Duration:  Transient/Acute. Context:  Exacerbation of underlying historical alcoholism with relapse secondary to stressors including legal trouble. .  Past Psychiatric History: Past Medical History  Diagnosis Date  . Tachycardia   . High cholesterol   . Alcohol abuse     reports that she has been smoking Cigarettes.  She has a 20 pack-year smoking history. She has never used smokeless tobacco. She reports that she drinks alcohol. She reports that she does not use illicit drugs. History reviewed. No pertinent family history. Family History Substance Abuse: No Family Supports: Yes, List: (mom, brother) Living Arrangements: Alone Can pt return to current living arrangement?: Yes Allergies:  No Known Allergies  ACT Assessment Complete:  Yes:    Educational Status    Risk to Self: Risk to self with the past 6 months Suicidal Ideation: No Suicidal Intent: No Is patient at risk for suicide?: No Suicidal Plan?: No Access to Means: No What has been your use of drugs/alcohol within the last 12 months?:  (yesterday) Previous Attempts/Gestures: No Intentional Self Injurious Behavior: None Family Suicide History: No Recent stressful life event(s): Financial Problems, Legal Issues Depression: Yes Depression Symptoms: Feeling angry/irritable, Loss of  interest in usual pleasures Substance abuse history and/or treatment for substance abuse?: Yes Suicide prevention information given to non-admitted patients: Not applicable  Risk to Others: Risk to Others  within the past 6 months Homicidal Ideation: No Thoughts of Harm to Others: No Current Homicidal Intent: No Current Homicidal Plan: No Access to Homicidal Means: No History of harm to others?: No Assessment of Violence: None Noted Does patient have access to weapons?: No Criminal Charges Pending?: No Does patient have a court date: No  Abuse: Abuse/Neglect Assessment (Assessment to be complete while patient is alone) Physical Abuse: Denies Verbal Abuse: Denies Sexual Abuse: Denies Exploitation of patient/patient's resources: Denies Self-Neglect: Denies  Prior Inpatient Therapy: Prior Inpatient Therapy Prior Inpatient Therapy: No  Prior Outpatient Therapy: Prior Outpatient Therapy Prior Outpatient Therapy: Yes Prior Therapy Dates: 6 months Prior Therapy Facilty/Provider(s):  Stephanie Parrish) Reason for Treatment:  (Pt refused)  Additional Information: Additional Information 1:1 In Past 12 Months?: No CIRT Risk: Yes Elopement Risk: No Does patient have medical clearance?: Yes                  Objective: Blood pressure 116/89, pulse 110, temperature 97.8 F (36.6 C), temperature source Oral, resp. rate 16, height _0  (1.651 m), weight 74.844 kg (165 lb), SpO2 97 %.Body mass index is 27.46 kg/(m^2). Results for orders placed or performed during the hospital encounter of 10/26/14 (from the past 72 hour(s))  Ethanol     Status: Abnormal   Collection Time: 10/26/14  2:07 PM  Result Value Ref Range   Alcohol, Ethyl (B) 219 (H) 0 - 11 mg/dL    Comment:        LOWEST DETECTABLE LIMIT FOR SERUM ALCOHOL IS 11 mg/dL FOR MEDICAL PURPOSES ONLY   CBC with Differential     Status: None   Collection Time: 10/26/14  2:07 PM  Result Value Ref Range   WBC 7.6 4.0 - 10.5 K/uL   RBC 4.58 3.87 - 5.11 MIL/uL   Hemoglobin 15.0 12.0 - 15.0 g/dL   HCT 42.4 36.0 - 46.0 %   MCV 92.6 78.0 - 100.0 fL   MCH 32.8 26.0 - 34.0 pg   MCHC 35.4 30.0 - 36.0 g/dL   RDW 13.0 11.5 - 15.5 %    Platelets 260 150 - 400 K/uL   Neutrophils Relative % 56 43 - 77 %   Neutro Abs 4.4 1.7 - 7.7 K/uL   Lymphocytes Relative 36 12 - 46 %   Lymphs Abs 2.7 0.7 - 4.0 K/uL   Monocytes Relative 6 3 - 12 %   Monocytes Absolute 0.4 0.1 - 1.0 K/uL   Eosinophils Relative 1 0 - 5 %   Eosinophils Absolute 0.1 0.0 - 0.7 K/uL   Basophils Relative 1 0 - 1 %   Basophils Absolute 0.1 0.0 - 0.1 K/uL  Basic metabolic panel     Status: Abnormal   Collection Time: 10/26/14  2:07 PM  Result Value Ref Range   Sodium 143 137 - 147 mEq/L   Potassium 4.1 3.7 - 5.3 mEq/L   Chloride 101 96 - 112 mEq/L   CO2 26 19 - 32 mEq/L   Glucose, Bld 106 (H) 70 - 99 mg/dL   BUN 14 6 - 23 mg/dL   Creatinine, Ser 0.69 0.50 - 1.10 mg/dL   Calcium 9.3 8.4 - 10.5 mg/dL   GFR calc non Af Amer >90 >90 mL/min   GFR calc Af Amer >90 >90 mL/min  Comment: (NOTE) The eGFR has been calculated using the CKD EPI equation. This calculation has not been validated in all clinical situations. eGFR's persistently <90 mL/min signify possible Chronic Kidney Disease.    Anion gap 16 (H) 5 - 15  Urine rapid drug screen (hosp performed)     Status: None   Collection Time: 10/26/14  2:14 PM  Result Value Ref Range   Opiates NONE DETECTED NONE DETECTED   Cocaine NONE DETECTED NONE DETECTED   Benzodiazepines NONE DETECTED NONE DETECTED   Amphetamines NONE DETECTED NONE DETECTED   Tetrahydrocannabinol NONE DETECTED NONE DETECTED   Barbiturates NONE DETECTED NONE DETECTED    Comment:        DRUG SCREEN FOR MEDICAL PURPOSES ONLY.  IF CONFIRMATION IS NEEDED FOR ANY PURPOSE, NOTIFY LAB WITHIN 5 DAYS.        LOWEST DETECTABLE LIMITS FOR URINE DRUG SCREEN Drug Class       Cutoff (ng/mL) Amphetamine      1000 Barbiturate      200 Benzodiazepine   277 Tricyclics       824 Opiates          300 Cocaine          300 THC              50   Pregnancy, urine     Status: None   Collection Time: 10/26/14  2:14 PM  Result Value Ref Range    Preg Test, Ur NEGATIVE NEGATIVE    Comment:        THE SENSITIVITY OF THIS METHODOLOGY IS >20 mIU/mL.   Urinalysis, Routine w reflex microscopic     Status: Abnormal   Collection Time: 10/26/14  2:14 PM  Result Value Ref Range   Color, Urine YELLOW YELLOW   APPearance CLEAR CLEAR   Specific Gravity, Urine 1.010 1.005 - 1.030   pH 5.5 5.0 - 8.0   Glucose, UA NEGATIVE NEGATIVE mg/dL   Hgb urine dipstick MODERATE (A) NEGATIVE   Bilirubin Urine NEGATIVE NEGATIVE   Ketones, ur NEGATIVE NEGATIVE mg/dL   Protein, ur NEGATIVE NEGATIVE mg/dL   Urobilinogen, UA 0.2 0.0 - 1.0 mg/dL   Nitrite NEGATIVE NEGATIVE   Leukocytes, UA TRACE (A) NEGATIVE  Urine microscopic-add on     Status: None   Collection Time: 10/26/14  2:14 PM  Result Value Ref Range   Squamous Epithelial / LPF RARE RARE   WBC, UA 0-2 <3 WBC/hpf   RBC / HPF 0-2 <3 RBC/hpf   Bacteria, UA RARE RARE   Labs are reviewed and are pertinent for BAL 219 upon admission.   Current Facility-Administered Medications  Medication Dose Route Frequency Provider Last Rate Last Dose  . acetaminophen (TYLENOL) tablet 650 mg  650 mg Oral Q4H PRN Kristen N Ward, DO   650 mg at 10/27/14 0939  . alum & mag hydroxide-simeth (MAALOX/MYLANTA) 200-200-20 MG/5ML suspension 30 mL  30 mL Oral PRN Francine Graven, DO   30 mL at 10/26/14 2009  . ibuprofen (ADVIL,MOTRIN) tablet 600 mg  600 mg Oral Q8H PRN Kristen N Ward, DO      . LORazepam (ATIVAN) tablet 0-4 mg  0-4 mg Oral 4 times per day Francine Graven, DO   2 mg at 10/26/14 2309   Followed by  . [START ON 10/28/2014] LORazepam (ATIVAN) tablet 0-4 mg  0-4 mg Oral Q12H Francine Graven, DO      . nicotine (NICODERM CQ - dosed in mg/24 hours) patch 21  mg  21 mg Transdermal Daily PRN Francine Graven, DO   21 mg at 10/26/14 2114  . ondansetron (ZOFRAN) tablet 4 mg  4 mg Oral Q8H PRN Kristen N Ward, DO      . thiamine (VITAMIN B-1) tablet 100 mg  100 mg Oral Daily Francine Graven, DO   100 mg at  10/27/14 0941   Or  . thiamine (B-1) injection 100 mg  100 mg Intravenous Daily Francine Graven, DO       Current Outpatient Prescriptions  Medication Sig Dispense Refill  . ibuprofen (ADVIL,MOTRIN) 600 MG tablet Take 1 tablet (600 mg total) by mouth every 6 (six) hours as needed. 30 tablet 0  . traMADol (ULTRAM) 50 MG tablet Take 1 tablet (50 mg total) by mouth every 6 (six) hours as needed. 20 tablet 0    Psychiatric Specialty Exam:     Blood pressure 116/89, pulse 110, temperature 97.8 F (36.6 C), temperature source Oral, resp. rate 16, height _0  (1.651 m), weight 74.844 kg (165 lb), SpO2 97 %.Body mass index is 27.46 kg/(m^2).  General Appearance: Casual and Fairly Groomed  Engineer, water::  Good  Speech:  Clear and Coherent and Normal Rate  Volume:  Normal  Mood:  Anxious  Affect:  Appropriate and Congruent  Thought Process:  Coherent  Orientation:  Full (Time, Place, and Person)  Thought Content:  WDL  Suicidal Thoughts:  No  Homicidal Thoughts:  No  Memory:  Immediate;   Fair Recent;   Fair Remote;   Fair  Judgement:  Fair  Insight:  Good  Psychomotor Activity:  Normal  Concentration:  Good  Recall:  Good  Akathisia:  No  Handed:    AIMS (if indicated):     Assets:  Communication Skills Desire for Improvement Resilience Social Support  Sleep:      Treatment Plan Summary: -Rescind IVC  Disposition: -Discharge home with recommendation to followup with her [facility] rehab program at [facility]. -Give handouts of community resources for alcoholism.   Benjamine Mola, FNP-BC 10/27/2014 9:12 AM  *Case reviewed with Dr. Dwyane Dee

## 2014-10-27 NOTE — ED Notes (Signed)
Patient given work note, discharge instructions reviewed including [place]; their contact information given to patient. Patient left in custody of Officer Willis.

## 2014-10-27 NOTE — Discharge Instructions (Signed)
Alcohol Use Disorder °Alcohol use disorder is a mental disorder. It is not a one-time incident of heavy drinking. Alcohol use disorder is the excessive and uncontrollable use of alcohol over time that leads to problems with functioning in one or more areas of daily living. People with this disorder risk harming themselves and others when they drink to excess. Alcohol use disorder also can cause other mental disorders, such as mood and anxiety disorders, and serious physical problems. People with alcohol use disorder often misuse other drugs.  °Alcohol use disorder is common and widespread. Some people with this disorder drink alcohol to cope with or escape from negative life events. Others drink to relieve chronic pain or symptoms of mental illness. People with a family history of alcohol use disorder are at higher risk of losing control and using alcohol to excess.  °SYMPTOMS  °Signs and symptoms of alcohol use disorder may include the following:  °· Consumption of alcohol in larger amounts or over a longer period of time than intended. °· Multiple unsuccessful attempts to cut down or control alcohol use.   °· A great deal of time spent obtaining alcohol, using alcohol, or recovering from the effects of alcohol (hangover). °· A strong desire or urge to use alcohol (cravings).   °· Continued use of alcohol despite problems at work, school, or home because of alcohol use.   °· Continued use of alcohol despite problems in relationships because of alcohol use. °· Continued use of alcohol in situations when it is physically hazardous, such as driving a car. °· Continued use of alcohol despite awareness of a physical or psychological problem that is likely related to alcohol use. Physical problems related to alcohol use can involve the brain, heart, liver, stomach, and intestines. Psychological problems related to alcohol use include intoxication, depression, anxiety, psychosis, delirium, and dementia.   °· The need for  increased amounts of alcohol to achieve the same desired effect, or a decreased effect from the consumption of the same amount of alcohol (tolerance). °· Withdrawal symptoms upon reducing or stopping alcohol use, or alcohol use to reduce or avoid withdrawal symptoms. Withdrawal symptoms include: °¨ Racing heart. °¨ Hand tremor. °¨ Difficulty sleeping. °¨ Nausea. °¨ Vomiting. °¨ Hallucinations. °¨ Restlessness. °¨ Seizures. °DIAGNOSIS °Alcohol use disorder is diagnosed through an assessment by your health care provider. Your health care provider may start by asking three or four questions to screen for excessive or problematic alcohol use. To confirm a diagnosis of alcohol use disorder, at least two symptoms must be present within a 12-month period. The severity of alcohol use disorder depends on the number of symptoms: °· Mild--two or three. °· Moderate--four or five. °· Severe--six or more. °Your health care provider may perform a physical exam or use results from lab tests to see if you have physical problems resulting from alcohol use. Your health care provider may refer you to a mental health professional for evaluation. °TREATMENT  °Some people with alcohol use disorder are able to reduce their alcohol use to low-risk levels. Some people with alcohol use disorder need to quit drinking alcohol. When necessary, mental health professionals with specialized training in substance use treatment can help. Your health care provider can help you decide how severe your alcohol use disorder is and what type of treatment you need. The following forms of treatment are available:  °· Detoxification. Detoxification involves the use of prescription medicines to prevent alcohol withdrawal symptoms in the first week after quitting. This is important for people with a history of symptoms   of withdrawal and for heavy drinkers who are likely to have withdrawal symptoms. Alcohol withdrawal can be dangerous and, in severe cases, cause  death. Detoxification is usually provided in a hospital or in-patient substance use treatment facility. °· Counseling or talk therapy. Talk therapy is provided by substance use treatment counselors. It addresses the reasons people use alcohol and ways to keep them from drinking again. The goals of talk therapy are to help people with alcohol use disorder find healthy activities and ways to cope with life stress, to identify and avoid triggers for alcohol use, and to handle cravings, which can cause relapse. °· Medicines. Different medicines can help treat alcohol use disorder through the following actions: °¨ Decrease alcohol cravings. °¨ Decrease the positive reward response felt from alcohol use. °¨ Produce an uncomfortable physical reaction when alcohol is used (aversion therapy). °· Support groups. Support groups are run by people who have quit drinking. They provide emotional support, advice, and guidance. °These forms of treatment are often combined. Some people with alcohol use disorder benefit from intensive combination treatment provided by specialized substance use treatment centers. Both inpatient and outpatient treatment programs are available. °Document Released: 01/08/2005 Document Revised: 04/17/2014 Document Reviewed: 03/10/2013 °ExitCare® Patient Information ©2015 [employer]. This information is not intended to replace advice given to you by your health care provider. Make sure you discuss any questions you have with your health care provider. ° ° ° ° °Emergency Department Resource Guide °1) Find a Doctor and Pay Out of Pocket °Although you won't have to find out who is covered by your insurance plan, it is a good idea to ask around and get recommendations. You will then need to call the office and see if the doctor you have chosen will accept you as a new patient and what types of options they offer for patients who are self-pay. Some doctors offer discounts or will set up payment plans for  their patients who do not have insurance, but you will need to ask so you aren't surprised when you get to your appointment. ° °2) Contact Your Local Health Department °Not all health departments have doctors that can see patients for sick visits, but many do, so it is worth a call to see if yours does. If you don't know where your local health department is, you can check in your phone book. The CDC also has a tool to help you locate your state's health department, and many state websites also have listings of all of their local health departments. ° °3) Find a Walk-in Clinic °If your illness is not likely to be very severe or complicated, you may want to try a walk in clinic. These are popping up all over the country in pharmacies, drugstores, and shopping centers. They're usually staffed by nurse practitioners or physician assistants that have been trained to treat common illnesses and complaints. They're usually fairly quick and inexpensive. However, if you have serious medical issues or chronic medical problems, these are probably not your best option. ° °No Primary Care Doctor: °- Call [facility] at  832-8000 - they can help you locate a primary care doctor that  accepts your insurance, provides certain services, etc. °- Physician Referral Service- 1-800-533-3463 ° °Chronic Pain Problems: °Organization         Address  Phone   Notes  °[facility]  (336) 297-2271 Patients need to be referred by their primary care doctor.  ° °Medication Assistance: °Organization           Address  Phone   Notes  °[place] [facility] [address] °[place], [place] [address] (336) 641-8030 --Must be a resident of [place] °-- Must have NO insurance coverage whatsoever (no Medicaid/ Medicare, etc.) °-- The pt. MUST have a primary care doctor that directs their care regularly and follows them in the community °  °[facility]  (866) 331-1348   °[employer]  (888)  892-1162   ° °Agencies that provide inexpensive medical care: °Organization         Address  Phone   Notes  °[facility]  (336) 832-8035   °[facility]    (336) 832-7272   °[facility] [address] °[place], [place] [address] (336) 832-4777   °[facility] [address], °[place] (336) 271-4999   °[facility]    (336) 373-0678   °[facility]    (336) 272-1050   °[facility] ° [address], [place] Phone:  (336) 832-4444, Fax:  (336) 832-4440 Hours of Operation:  9 am - 6 pm, M-F.  Also accepts Medicaid/Medicare and self-pay.  °[facility] ° [address], [place] Phone: (336) 832-3150, Fax: (336) 832-3151. Hours of Operation:  8:30 am - 5:30 pm, M-F.  Also accepts Medicaid and self-pay.  °[facility] [place] [address], [place] Phone: (336) 878-6027   °[facility] [address], [place], [place] (336)723-1848, Ext. 123 Mondays & Thursdays: 7-9 AM.  First 15 patients are seen on a first come, first serve basis. °  ° °Medicaid-accepting [place] Providers: ° °Organization         Address  Phone   Notes  °[facility] [address], [place] (336) 641-2100 Also accepts self-pay patients.  °[facility] [address], [place] ° (336) 856-9996   °[facility] [address], [place] (336) 288-8857   °[facility] [address] [place] [address], [place] (336) 299-7000   °Veita Bland [address], [place]  ° (336) 373-1557 Only accepts [place] Access Medicaid patients after they have their name applied to their card.  ° °Self-Pay (no insurance) in [place]: ° °Organization         Address  Phone   Notes  °Sickle Cell Patients, [facility] [address], [place] (336)  832-1970   °[facility] [address], [place] (336) 832-4400   °[facility] ° [address], [place] (336) 992-4800   °[facility]/Dr. Osei-Bonsu ° [address] [place] [address], [place] or [address], [place] (336) 841-8500 Phone number for both [place] and [place] locations is the same.  °[facility] [address], [place] (336) 299-0000   °[facility] [address] [place] [address],  or [address] (336) 852-7530 °(336) 878-2260   °[facility] [address],  (336) 350-1642, phone; (336) 294-5005, fax Sees patients 1st and 3rd Saturday of every month.  Must not qualify for public or private insurance (i.e. Medicaid, Medicare, [place] Health Choice, Veterans' Benefits) • Household income should be no more than 200% of the poverty level •The clinic cannot treat you if you are pregnant or think you   are pregnant • Sexually transmitted diseases are not treated at the clinic.  ° ° °Dental Care: °Organization         Address  Phone  Notes  °[place] [facility] [address], [place] (336) 641-6152 Accepts children up to age 21 who are enrolled in Medicaid or [place] Health Choice; pregnant women with a Medicaid card; and children who have applied for Medicaid or [place] Health Choice, but were declined, whose parents can pay a reduced fee at time of service.  °[facility] [place]  [address], [place] (336) 641-7733 Accepts children up to age 21 who are enrolled in Medicaid or [place] Health Choice; pregnant women with a Medicaid card; and children who have applied for Medicaid or [place] Health Choice, but were declined, whose parents can pay a reduced fee at time of service.  °[facility] ° [address], [place] (336) 641-4533 Patients are  seen by appointment only. Walk-ins are not accepted. [facility] will see patients 18 years of age and older. °Monday - Tuesday (8am-5pm) °Most Wednesdays (8:30-5pm) °$30 per visit, cash only  °[facility] ° [address], [place] (336) 641-4533 Patients are seen by appointment only. Walk-ins are not accepted. [facility] will see patients 18 years of age and older. °One Wednesday Evening (Monthly: Volunteer Based).  $30 per visit, cash only  °[facility]  (919) 537-3737 for adults; Children under age 4, call Graduate Pediatric Dentistry at (919) [phone]. Children aged 4-14, please call (919) [phone] to request a pediatric application. ° Dental services are provided in all areas of dental care including fillings, crowns and bridges, complete and partial dentures, implants, gum treatment, root canals, and extractions. Preventive care is also provided. Treatment is provided to both adults and children. °Patients are selected via a lottery and there is often a waiting list. °  °[facility] [address], °[place] ° (336) 763-8833 [email or web address] °  °[facility] [address], [place], [place] (336)723-1848, Ext. 123 Second and Fourth Thursday of each month, opens at 6:30 AM; Clinic ends at 9 AM.  Patients are seen on a first-come first-served basis, and a limited number are seen during each clinic.  ° °[facility] ° [address], [place], [place] (336) 723-7904   Eligibility Requirements °You must have lived in [place], [place], or [place] counties for at least the last three months. °  You cannot be eligible for state or federal sponsored healthcare insurance, including Veterans Administration, Medicaid, or Medicare. °  You generally cannot be eligible for healthcare insurance through your employer.  °  How to apply: °Eligibility screenings are held every Tuesday and Wednesday afternoon from 1:00 pm until 4:00  pm. You do not need an appointment for the interview!  °[facility] [address], [place], [place] 336-631-2330   °[facility]  336-342-8273   °[facility]  336-703-3100   °[facility]  336-570-6415   ° °Behavioral Health Resources in the Community: °Intensive Outpatient Programs °Organization         Address  Phone  Notes  °[facility] [address], [place], [place] 336-878-6098   °[facility] [address], [place], [place] 336-832-9800   °[facility] [address], [place], [place] ° 336-882-2125   °[facility] [address],  °[place], [place] 1-800-853-5163 or 336-641-4981   °Substance Abuse Resources °Organization         Address  Phone  Notes  °[facility]  336-882-2125   °[facility]  336-784-9470   °[facility]  336-285-9073   °[facility]  336-845-3988   °Residential & Outpatient Substance Abuse Program  1-800-659-3381   °Psychological Services °Organization         Address  Phone  Notes  °[facility]  336- 832-9600   °[facility]  336- 378-7881   °[facility] [address], [place] 1-800-853-5163 or 336-641-4981   ° °Mobile Crisis Teams °Organization         Address  Phone  Notes  °[facility], Mobile Crisis Care Unit  1-877-626-1772   °Assertive °[facility] ° [address] [place], [place] 336-834-9664   °Sharon DeEsch [address] °[place] 336-554-5454   ° °Self-Help/Support Groups °Organization         Address  Phone             Notes  °[facility] - variety of support groups  336- 373-1402 Call for more information  °Narcotics Anonymous (NA), [facility] [address], °[place]  2 meetings at this location  ° °Residential Treatment Programs °Organization          Address  Phone  Notes  °[facility] [address],    °[place]  1-866-801-8205   °[facility] ° [address], [place], [place] 704-293-8524   °[facility] [address], [place] 336-845-3988 Admissions: 8am-3pm M-F  °[facility] [address]    °[place], [place] 336-841-1104   °[facility] [address], [place], [place] 336-379-7146   °[facility] [address]  °[place], [place] 336-285-9073   °[facility] - Intensive Outpatient [address], [place], [place] 336-852-3033   °[facility] [address]  °[place], [place] 1-877-615-2722 or 336-784-9470   °[facility] [address] [place], [place] 336-227-7417 Accepts Medicaid  °[facility] [address]  °[place] 1-800-659-3381 Substance Abuse/Addiction Treatment  ° °[place] Behavioral Health Resources °Organization         Address  Phone  Notes  °[facility]  (888) 581-9988   °Julie Brannon, PhD [address] [place], [place]   (336) 349-5553 or (336) 951-0000   °[facility]   [address] °[place], [place] (336) 349-4454   °[facility] [address], [place], [place] (336) 342-8316 Insurance/Medicaid/sponsorship through [facility]  °[facility] [address]                                    [place], [place] (336) 342-8316 Therapy/tele-psych/case  °[facility] [address]  ° [place],  (336) 349-2233    °Dr. Arfeen  (336) 349-4544   °[facility] [place]  [employer] [place] [facility] 1) [address], [place] °2) [address], [place] °3)  [address], [place] (336) 349-3220 °(336) 342-7768 ° °(336) 342-8140   °[place] [facility] (336)   342-1394 or (336) 342-3537 (After Hours)    ° ° ° °

## 2014-10-27 NOTE — ED Notes (Signed)
Pt has requested to see EDP Preston FleetingGlick x2, both time pt has been asleep and physician doesn't want to wake her.

## 2015-05-06 ENCOUNTER — Emergency Department (HOSPITAL_COMMUNITY)
Admission: EM | Admit: 2015-05-06 | Discharge: 2015-05-06 | Disposition: A | Discharge status: Home / Self Care | Payer: Medicaid Other | Attending: Emergency Medicine | Admitting: Emergency Medicine

## 2015-05-06 ENCOUNTER — Encounter (HOSPITAL_COMMUNITY): Payer: Self-pay | Admitting: *Deleted

## 2015-05-06 DIAGNOSIS — F419 Anxiety disorder, unspecified: Secondary | ICD-10-CM | POA: Insufficient documentation

## 2015-05-06 DIAGNOSIS — Z8639 Personal history of other endocrine, nutritional and metabolic disease: Secondary | ICD-10-CM | POA: Insufficient documentation

## 2015-05-06 DIAGNOSIS — Z720 Tobacco use: Secondary | ICD-10-CM | POA: Insufficient documentation

## 2015-05-06 DIAGNOSIS — Z79899 Other long term (current) drug therapy: Secondary | ICD-10-CM | POA: Insufficient documentation

## 2015-05-06 DIAGNOSIS — Z760 Encounter for issue of repeat prescription: Secondary | ICD-10-CM

## 2015-05-06 MED ORDER — OXCARBAZEPINE 600 MG PO TABS: 600.0000 mg | ORAL_TABLET | Freq: Two times a day (BID) | ORAL | Status: AC

## 2015-05-06 MED ORDER — HYDROXYZINE PAMOATE 100 MG PO CAPS: 100.0000 mg | ORAL_CAPSULE | Freq: Every day | ORAL | Status: AC

## 2015-05-06 MED ORDER — FLUOXETINE HCL 10 MG PO TABS: 10.0000 mg | ORAL_TABLET | Freq: Every day | ORAL | Status: AC

## 2015-05-06 NOTE — Discharge Instructions (Signed)
Medication Refill, Emergency Department °We have refilled your medication today as a courtesy to you. It is best for your medical care, however, to take care of getting refills done through your primary caregiver's office. They have your records and can do a better job of follow-up than we can in the emergency department. °On maintenance medications, we often only prescribe enough medications to get you by until you are able to see your regular caregiver. This is a more expensive way to refill medications. °In the future, please plan for refills so that you will not have to use the emergency department for this. °Thank you for your help. Your help allows us to better take care of the daily emergencies that enter our department. °Document Released: 03/19/2004 Document Revised: 02/23/2012 Document Reviewed: 03/10/2014 °ExitCare® Patient Information ©2015 [employer]. This information is not intended to replace advice given to you by your health care provider. Make sure you discuss any questions you have with your health care provider. ° °

## 2015-05-06 NOTE — ED Provider Notes (Signed)
CSN: [id number]     Arrival date & time 05/06/15  0631 History   First MD Initiated Contact with Patient 05/06/15 [phone]     Chief Complaint  Patient presents with  . Medication Problem    patient is a recovering alcoholic  and just  got of jail and has not taken her meds in 2 days.  she takes trileptal and prozac.     (Consider location/radiation/quality/duration/timing/severity/associated sxs/prior Treatment) HPI  This a 39 year old female with a history of alcohol abuse who presents requesting refills of her Prozac, Vistaril, and Trileptal.  Patient reports that she recently got out of jail 3 days ago after serving time for DUI. She reports that she has not had any alcohol recently. She was on Prozac, Vistaril, and Trileptal while in jail. She thought she could "stop cold [place]."  However, patient reports over the last 2 days she has had increasing labile mood, episodes of crying, and generalized body shaking. She feels that she will relapse into alcohol abuse is she does not get back on her medication.  She denies any physical symptoms otherwise.  Past Medical History  Diagnosis Date  . Tachycardia   . High cholesterol   . Alcohol abuse    Past Surgical History  Procedure Laterality Date  . Breast enhancement surgery     History reviewed. No pertinent family history. History  Substance Use Topics  . Smoking status: Current Every Day Smoker -- 1.00 packs/day for 20 years    Types: Cigarettes  . Smokeless tobacco: Never Used  . Alcohol Use: Yes     Comment: occ   OB History    Gravida Para Term Preterm AB TAB SAB Ectopic Multiple Living   10    4 1 3   6      Review of Systems  Constitutional: Negative for fever.       Shaking  Respiratory: Negative for chest tightness and shortness of breath.   Cardiovascular: Negative for chest pain.  Gastrointestinal: Negative for abdominal pain.  Psychiatric/Behavioral: Negative for suicidal ideas, confusion and self-injury. The  patient is nervous/anxious.        Crying  All other systems reviewed and are negative.     Allergies  Review of patient's allergies indicates no known allergies.  Home Medications   Prior to Admission medications   Medication Sig Start Date End Date Taking? Authorizing Provider  FLUoxetine (PROZAC) 10 MG tablet Take 1 tablet (10 mg total) by mouth daily. 05/06/15   Shon Batonourtney F Evalynn Hankins, MD  hydrOXYzine (VISTARIL) 100 MG capsule Take 1 capsule (100 mg total) by mouth at bedtime. 05/06/15   Shon Batonourtney F Aqsa Sensabaugh, MD  ibuprofen (ADVIL,MOTRIN) 600 MG tablet Take 1 tablet (600 mg total) by mouth every 6 (six) hours as needed. 10/25/14   Burgess AmorJulie Idol, PA-C  oxcarbazepine (TRILEPTAL) 600 MG tablet Take 1 tablet (600 mg total) by mouth 2 (two) times daily. 05/06/15   Shon Batonourtney F Ashea Winiarski, MD  traMADol (ULTRAM) 50 MG tablet Take 1 tablet (50 mg total) by mouth every 6 (six) hours as needed. 10/25/14   Burgess AmorJulie Idol, PA-C   BP 136/87 mmHg  Pulse 112  Temp(Src) 98.3 F (36.8 C) (Oral)  Resp 20  Ht 5\' 5"  (1.651 m)  Wt 170 lb (77.111 kg)  BMI 28.29 kg/m2  SpO2 100% Physical Exam  Constitutional: She is oriented to person, place, and time. She appears well-developed and well-nourished. No distress.  HENT:  Head: Normocephalic and atraumatic.  Cardiovascular: Normal rate  and regular rhythm.   Pulmonary/Chest: Effort normal. No respiratory distress.  Neurological: She is alert and oriented to person, place, and time.  Skin: Skin is warm and dry.  Psychiatric: She has a normal mood and affect.  Pressured speech, appears insightful  Nursing note and vitals reviewed.   ED Course  Procedures (including critical care time) Labs Review Labs Reviewed - No data to display  Imaging Review No results found.   EKG Interpretation None      MDM   Final diagnoses:  Medication refill    Patient presents requesting medication refill. Nontoxic on exam. Mild tachycardia. She appears anxious and has a  history of tachycardia. No other physical symptoms. Discussed with patient that I will refill 1 month of her medications. Otherwise she will need to reestablish primary care for further medication prescriptions.  After history, exam, and medical workup I feel the patient has been appropriately medically screened and is safe for discharge home. Pertinent diagnoses were discussed with the patient. Patient was given return precautions.     Shon Baton, MD 05/06/15 [phone]

## 2016-04-17 IMAGING — CR DG FOOT COMPLETE 3+V*L*
3 series · 3 of 3 positions shown · non-contrast
Comparison: None.

CLINICAL DATA: Fall.  Foot pain

EXAM:
LEFT FOOT - COMPLETE 3+ VIEW

[view not recorded (1 of 3)]
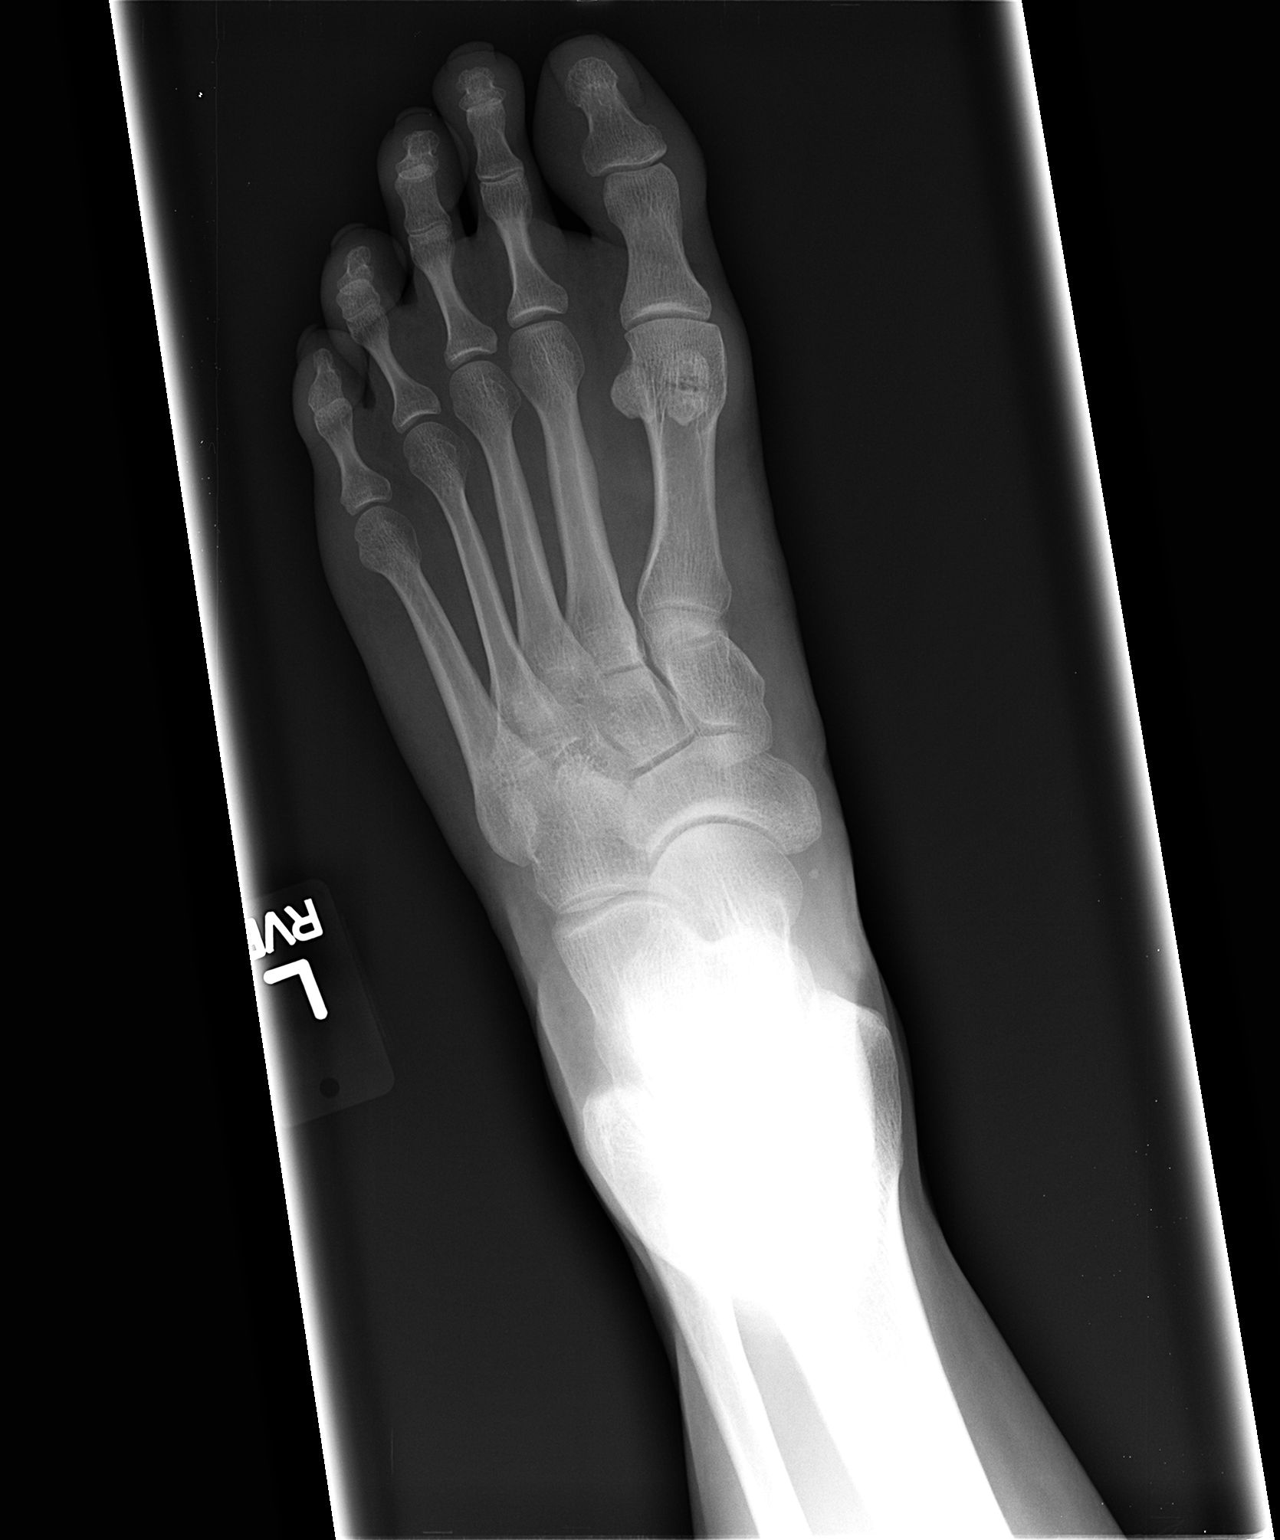

[view not recorded (2 of 3)]
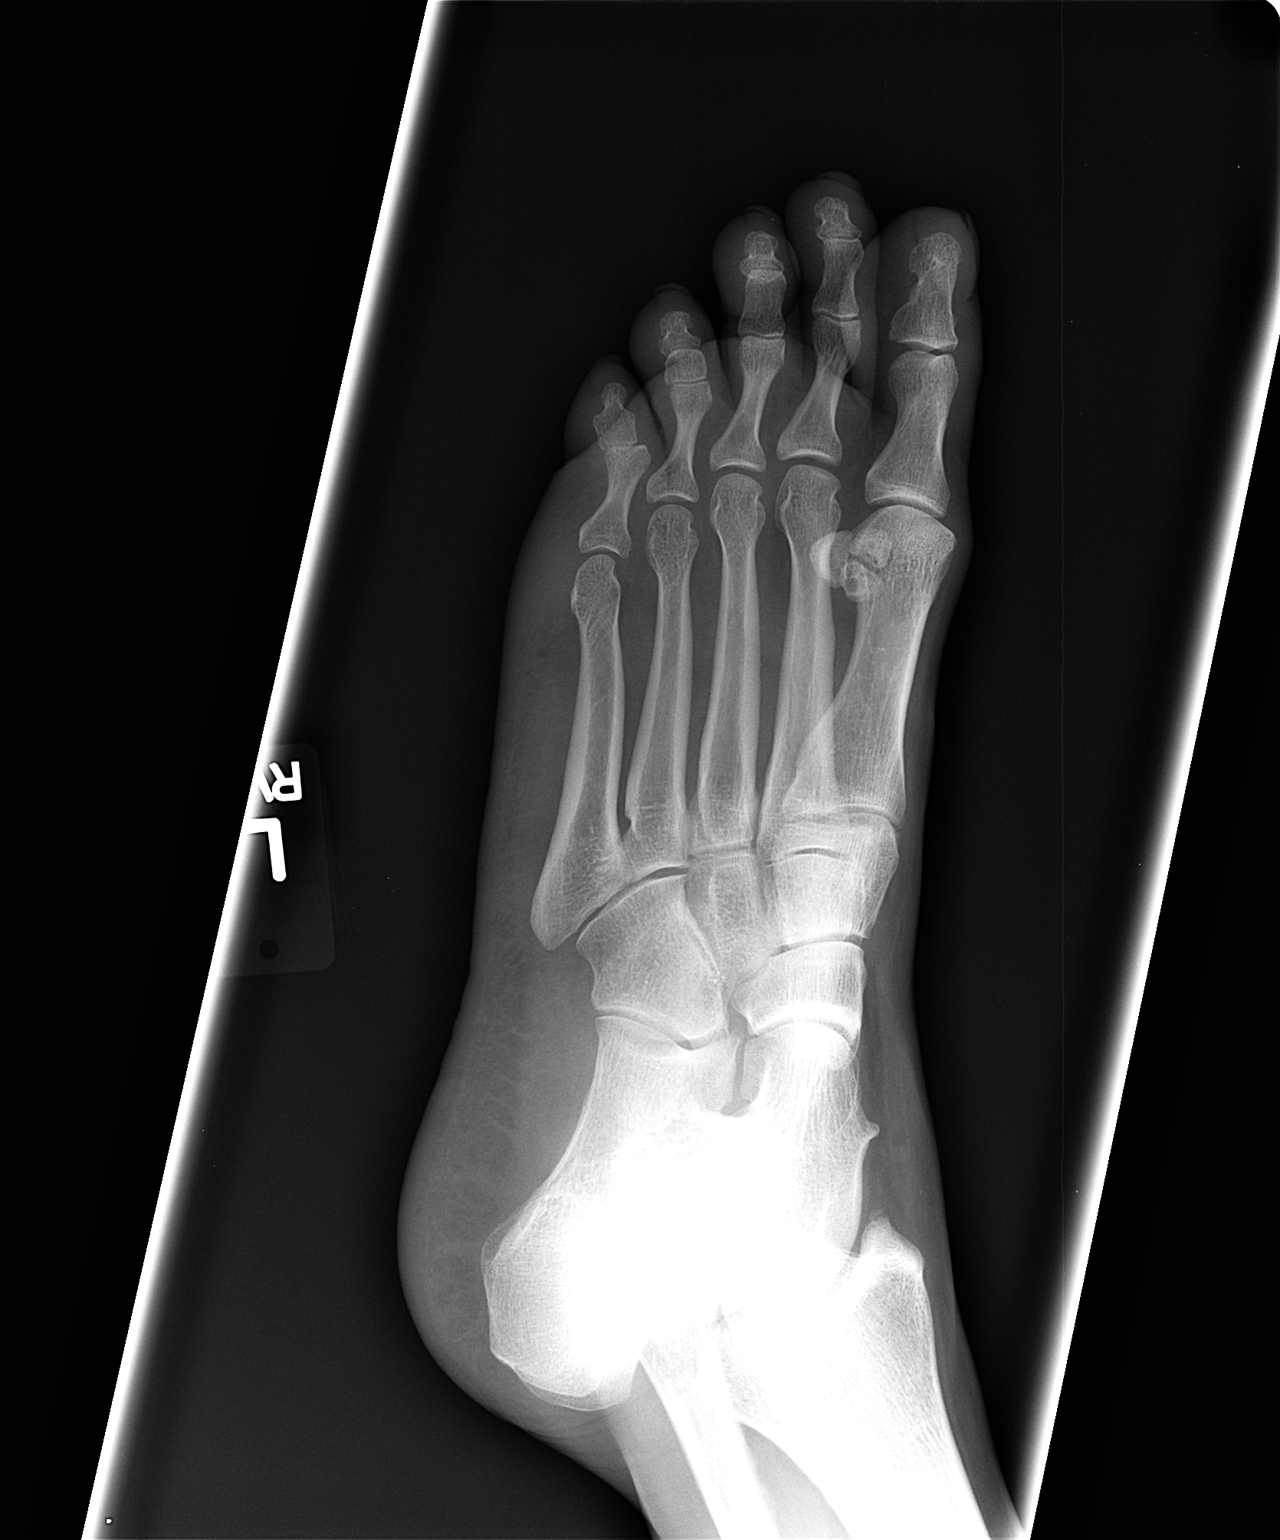

[view not recorded (3 of 3)]
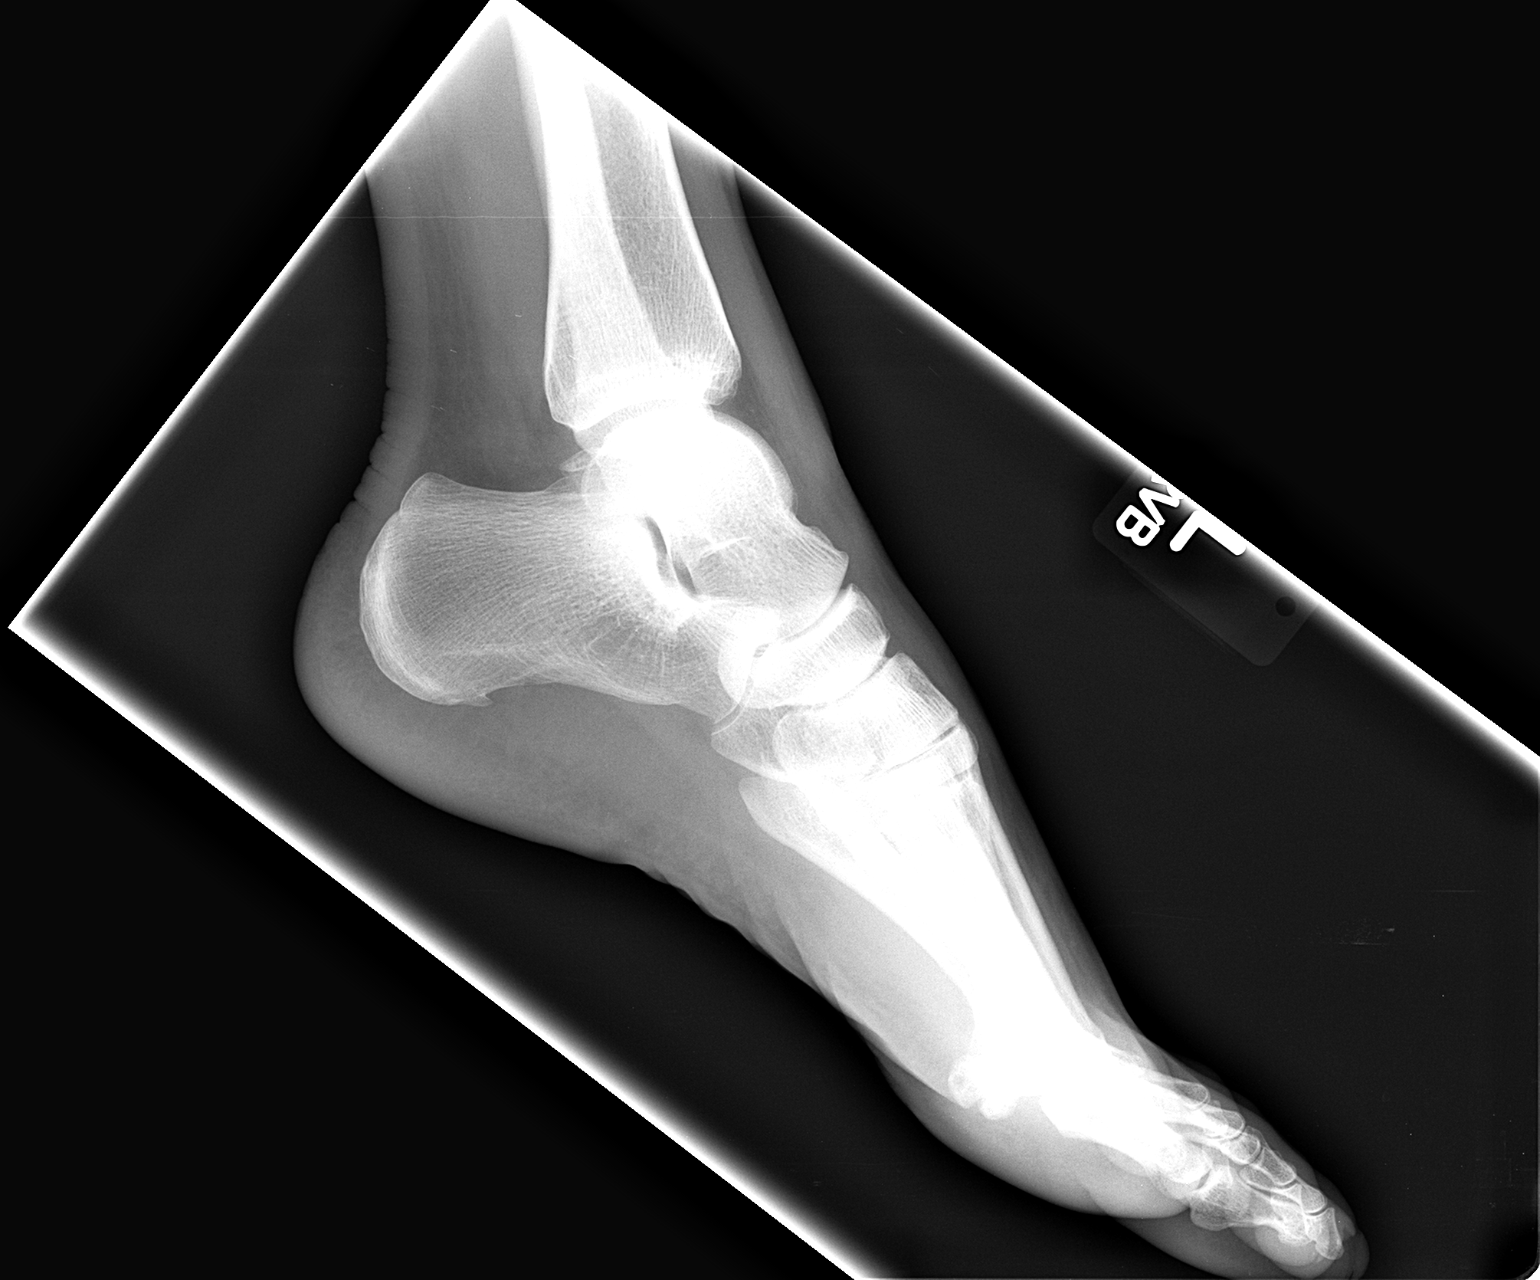

[3 of 3 positions shown; findings below may reference images not displayed]

FINDINGS: There is no evidence of fracture or dislocation. There is no
evidence of arthropathy or other focal bone abnormality. Soft
tissues are unremarkable.
IMPRESSION: Negative.
